# Patient Record
Sex: Female | Born: 1985 | Race: White | Hispanic: No | Marital: Single | Smoking: Never smoker
Health system: Southern US, Community
[De-identification: ages and names within clinical notes are randomized; demographics above are authoritative.]

## PROBLEM LIST (undated history)

## (undated) ENCOUNTER — Inpatient Hospital Stay: Payer: Self-pay

## (undated) DIAGNOSIS — K37 Unspecified appendicitis: Secondary | ICD-10-CM

## (undated) HISTORY — DX: Unspecified appendicitis: K37

---
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
## 2014-06-29 ENCOUNTER — Ambulatory Visit: Payer: Self-pay | Admitting: Family Medicine

## 2014-10-18 ENCOUNTER — Ambulatory Visit: Payer: Self-pay | Admitting: Family Medicine

## 2014-10-18 LAB — RAPID STREP-A WITH REFLX: MICRO TEXT REPORT: NEGATIVE

## 2014-10-21 LAB — BETA STREP CULTURE(ARMC)

## 2015-10-16 NOTE — L&D Delivery Note (Signed)
Delivery Note Primary OB: [facility] Delivery Physician: Annamarie MajorPaul Harris, MD Gestational Age: Full term Antepartum complications: none Intrapartum complications: None.  Fever right before delivery (101).  A viable Female was delivered via vertex perentation.  Apgars:8 ,9  Weight:  pending .   Placenta status: spontaneous and Intact.  Cord: 3+ vessels;  with the following complications: none.  Anesthesia:  epidural Episiotomy:  none Lacerations:  2nd Suture Repair: 2.0 vicryl Est. Blood Loss (mL):  200 mL  Mom to postpartum.  Baby to Couplet care / Skin to Skin.  Annamarie MajorPaul Harris, MD Dept of OB/GYN [phone]

## 2016-07-15 LAB — HM PAP SMEAR: HM Pap smear: NORMAL

## 2016-09-15 ENCOUNTER — Observation Stay
Admission: EM | Admit: 2016-09-15 | Discharge: 2016-09-15 | Disposition: A | Discharge status: Home / Self Care | Payer: BC Managed Care – PPO | Source: Home / Self Care | Admitting: Obstetrics & Gynecology

## 2016-09-15 DIAGNOSIS — Z3A40 40 weeks gestation of pregnancy: Secondary | ICD-10-CM

## 2016-09-15 DIAGNOSIS — R109 Unspecified abdominal pain: Secondary | ICD-10-CM | POA: Insufficient documentation

## 2016-09-15 DIAGNOSIS — O26899 Other specified pregnancy related conditions, unspecified trimester: Secondary | ICD-10-CM

## 2016-09-15 DIAGNOSIS — R32 Unspecified urinary incontinence: Secondary | ICD-10-CM

## 2016-09-15 DIAGNOSIS — O26893 Other specified pregnancy related conditions, third trimester: Secondary | ICD-10-CM

## 2016-09-15 DIAGNOSIS — N898 Other specified noninflammatory disorders of vagina: Secondary | ICD-10-CM

## 2016-09-15 MED ORDER — ACETAMINOPHEN 325 MG PO TABS: 650.0000 mg | ORAL_TABLET | ORAL | Status: DC | PRN

## 2016-09-15 MED ORDER — ONDANSETRON HCL 4 MG/2ML IJ SOLN: 4.0000 mg | Freq: Four times a day (QID) | INTRAMUSCULAR | Status: DC | PRN

## 2016-09-15 NOTE — Discharge Summary (Signed)
  See FPN 

## 2016-09-15 NOTE — OB Triage Note (Addendum)
Pt presents c/o  continuous LOF. States baby is moving well, Denies any pain or bleeding. Nitrazine questionable.

## 2016-09-15 NOTE — Final Progress Note (Signed)
Physician Final Progress Note  Patient ID: Joanna DolinKatie Aldridge Lynch MRN: [id number] DOB/AGE: 30/29/1987 30 y.o.  Admit date: 09/15/2016 Admitting provider: Nadara Mustardobert P Nella Botsford, MD Discharge date: 09/15/2016   Admission Diagnoses: Abdominal pain in pregnancy, also has vaginal discharge and urinary leakage  Discharge Diagnoses:  Active Problems:   Abdominal pain affecting pregnancy   Leakage of fluid/ vaginal discharge;  Urinary incontinence  Significant Findings/ Diagnostic Studies: Pt is a 30 yo G1P0 at 40 weeks with c/o vaginal discharge today, low grade lower abdominal pains.  No VB or fever, good FM.   PMH-none PSH- none FH- no GYN cancer or other d/o SH- No tob or EtOH or drugs ROS- as above, no other GYN or any other concerns EXAM- AF, VSS Chest clear Heart reg Extr no edema Back no CVAT SSE- +pool, Neg Nitrazine, Neg Fern SVE- no ext lesions; Cervix 1/70/-2 FHT 140s, no decels TOCO no ctxs  Procedures: A NST procedure was performed with FHR monitoring and a normal baseline established, appropriate time of 20-40 minutes of evaluation, and accels >2 seen w 15x15 characteristics.  Results show a REACTIVE NST.   Discharge Condition: good  Disposition: Final discharge disposition not confirmed  Diet: Regular diet  Discharge Activity: Activity as tolerated     Medication List    TAKE these medications   PRENATAL 1 PO Take by mouth daily.   ranitidine 150 MG tablet Commonly known as:  ZANTAC Take 150 mg by mouth 2 (two) times daily.   TUMS ULTRA 1000 400 MG chewable tablet Generic drug:  calcium elemental as carbonate Chew 1,000 mg by mouth 3 (three) times daily.      Follow-up Information    [facility]. Go on 09/17/2016.   Why:  Arrive at 8 pm for induction of labor. Contact information: [address] [place] [phone]          Total time spent taking care of this patient: 30 minutes  Signed: Letitia Libraobert Paul  Ibrahim Mcpheeters 09/15/2016, 3:03 PM

## 2016-09-15 NOTE — Discharge Instructions (Signed)
Braxton Hicks Contractions °Contractions of the uterus can occur throughout pregnancy. Contractions are not always a sign that you are in labor.  °WHAT ARE BRAXTON HICKS CONTRACTIONS?  °Contractions that occur before labor are called Braxton Hicks contractions, or false labor. Toward the end of pregnancy (32-34 weeks), these contractions can develop more often and may become more forceful. This is not true labor because these contractions do not result in opening (dilatation) and thinning of the cervix. They are sometimes difficult to tell apart from true labor because these contractions can be forceful and people have different pain tolerances. You should not feel embarrassed if you go to the hospital with false labor. Sometimes, the only way to tell if you are in true labor is for your health care provider to look for changes in the cervix. °If there are no prenatal problems or other health problems associated with the pregnancy, it is completely safe to be sent home with false labor and await the onset of true labor. °HOW CAN YOU TELL THE DIFFERENCE BETWEEN TRUE AND FALSE LABOR? °False Labor  °· The contractions of false labor are usually shorter and not as hard as those of true labor.   °· The contractions are usually irregular.   °· The contractions are often felt in the front of the lower abdomen and in the groin.   °· The contractions may go away when you walk around or change positions while lying down.   °· The contractions get weaker and are shorter lasting as time goes on.   °· The contractions do not usually become progressively stronger, regular, and closer together as with true labor.   °True Labor  °· Contractions in true labor last 30-70 seconds, become very regular, usually become more intense, and increase in frequency.   °· The contractions do not go away with walking.   °· The discomfort is usually felt in the top of the uterus and spreads to the lower abdomen and low back.   °· True labor can be  determined by your health care provider with an exam. This will show that the cervix is dilating and getting thinner.   °WHAT TO REMEMBER °· Keep up with your usual exercises and follow other instructions given by your health care provider.   °· Take medicines as directed by your health care provider.   °· Keep your regular prenatal appointments.   °· Eat and drink lightly if you think you are going into labor.   °· If Braxton Hicks contractions are making you uncomfortable:   °¨ Change your position from lying down or resting to walking, or from walking to resting.   °¨ Sit and rest in a tub of warm water.   °¨ Drink 2-3 glasses of water. Dehydration may cause these contractions.   °¨ Do slow and deep breathing several times an hour.   °WHEN SHOULD I SEEK IMMEDIATE MEDICAL CARE? °Seek immediate medical care if: °· Your contractions become stronger, more regular, and closer together.   °· You have fluid leaking or gushing from your vagina.   °· You have a fever.   °· You pass blood-tinged mucus.   °· You have vaginal bleeding.   °· You have continuous abdominal pain.   °· You have low back pain that you never had before.   °· You feel your baby's head pushing down and causing pelvic pressure.   °· Your baby is not moving as much as it used to.   °This information is not intended to replace advice given to you by your health care provider. Make sure you discuss any questions you have with your health care   provider. °Document Released: 10/01/2005 Document Revised: 01/23/2016 Document Reviewed: 07/13/2013 °Elsevier Interactive Patient Education © 2017 Elsevier Inc. ° °

## 2016-09-16 ENCOUNTER — Inpatient Hospital Stay
Admission: EM | Admit: 2016-09-16 | Discharge: 2016-09-19 | DRG: 774 | Disposition: A | Discharge status: Home / Self Care | Payer: BC Managed Care – PPO | Attending: Obstetrics & Gynecology | Admitting: Obstetrics & Gynecology

## 2016-09-16 DIAGNOSIS — M419 Scoliosis, unspecified: Secondary | ICD-10-CM | POA: Diagnosis present

## 2016-09-16 DIAGNOSIS — Z3A40 40 weeks gestation of pregnancy: Secondary | ICD-10-CM

## 2016-09-16 DIAGNOSIS — Z3493 Encounter for supervision of normal pregnancy, unspecified, third trimester: Secondary | ICD-10-CM | POA: Diagnosis present

## 2016-09-16 LAB — TYPE AND SCREEN
ABO/RH(D): O POS
ANTIBODY SCREEN: NEGATIVE

## 2016-09-16 LAB — CBC
HCT: 36.8 % (ref 35.0–47.0)
HEMOGLOBIN: 12.8 g/dL (ref 12.0–16.0)
MCH: 30.6 pg (ref 26.0–34.0)
MCHC: 34.9 g/dL (ref 32.0–36.0)
MCV: 87.7 fL (ref 80.0–100.0)
Platelets: 195 10*3/uL (ref 150–440)
RBC: 4.19 MIL/uL (ref 3.80–5.20)
RDW: 14.5 % (ref 11.5–14.5)
WBC: 14 10*3/uL — ABNORMAL HIGH (ref 3.6–11.0)

## 2016-09-16 MED ORDER — OXYTOCIN 10 UNIT/ML IJ SOLN
INTRAMUSCULAR | Status: AC
  Filled 2016-09-16: qty 2

## 2016-09-16 MED ORDER — BUTORPHANOL TARTRATE 1 MG/ML IJ SOLN
1.0000 mg | INTRAMUSCULAR | Status: DC | PRN
  Administered 2016-09-16 – 2016-09-17 (×2): 1 mg via INTRAVENOUS
  Filled 2016-09-16 (×2): qty 1

## 2016-09-16 MED ORDER — LACTATED RINGERS IV SOLN: 500.0000 mL | INTRAVENOUS | Status: DC | PRN

## 2016-09-16 MED ORDER — OXYTOCIN 40 UNITS IN LACTATED RINGERS INFUSION - SIMPLE MED
2.5000 [IU]/h | INTRAVENOUS | Status: DC
  Filled 2016-09-16 (×2): qty 1000

## 2016-09-16 MED ORDER — ONDANSETRON HCL 4 MG/2ML IJ SOLN: 4.0000 mg | Freq: Four times a day (QID) | INTRAMUSCULAR | Status: DC | PRN

## 2016-09-16 MED ORDER — LIDOCAINE HCL (PF) 1 % IJ SOLN
INTRAMUSCULAR | Status: AC
  Filled 2016-09-16: qty 30

## 2016-09-16 MED ORDER — LACTATED RINGERS IV SOLN
INTRAVENOUS | Status: DC
  Administered 2016-09-16: 23:00:00 via INTRAVENOUS

## 2016-09-16 MED ORDER — OXYTOCIN BOLUS FROM INFUSION: 500.0000 mL | Freq: Once | INTRAVENOUS | Status: DC

## 2016-09-16 MED ORDER — ACETAMINOPHEN 325 MG PO TABS: 650.0000 mg | ORAL_TABLET | ORAL | Status: DC | PRN

## 2016-09-16 MED ORDER — AMMONIA AROMATIC IN INHA
RESPIRATORY_TRACT | Status: AC
  Filled 2016-09-16: qty 10

## 2016-09-16 MED ORDER — MISOPROSTOL 200 MCG PO TABS
ORAL_TABLET | ORAL | Status: AC
  Filled 2016-09-16: qty 4

## 2016-09-16 NOTE — Progress Notes (Signed)
Obstetrics Admission History & Physical   Contractions   HPI:  30 y.o. G1P0 @ 2358w5d (09/11/2016, by Other Basis). Admitted on 09/16/2016:   Patient Active Problem List   Diagnosis Date Noted  . Normal labor 09/16/2016  . Abdominal pain affecting pregnancy 09/15/2016     Presents for ctx at ter,m, all day but worse now, with cervical change.  No ROM or VB.  Good FM.Marland Kitchen.  [facility]  PMHx: History reviewed. No pertinent past medical history. PSHx:  Past Surgical History:  Procedure Laterality Date  . NASAL SEPTUM SURGERY  2005   Medications:  Prescriptions Prior to Admission  Medication Sig Dispense Refill Last Dose  . calcium elemental as carbonate (TUMS ULTRA 1000) 400 MG chewable tablet Chew 1,000 mg by mouth 3 (three) times daily.   09/15/2016  . Prenatal MV-Min-Fe Fum-FA-DHA (PRENATAL 1 PO) Take by mouth daily.   09/15/2016  . ranitidine (ZANTAC) 150 MG tablet Take 150 mg by mouth 2 (two) times daily.   09/15/2016   Allergies: has no allergies on file. OBHx:  OB History  Gravida Para Term Preterm AB Living  1 0          SAB TAB Ectopic Multiple Live Births               # Outcome Date GA Lbr Len/2nd Weight Sex Delivery Anes PTL Lv  1 Current              ZOX:[id number]:Negative/unremarkable except as detailed in HPI. Soc Hx: Never smoker, Alcohol: none,and Recreational drug use: none  Objective:   Vitals:   09/16/16 2110  BP: (!) 147/80  Pulse: (!) 59   General: Well nourished, well developed female in no acute distress.  Skin: Warm and dry.  Cardiovascular:Regular rate and rhythm. Respiratory: Clear to auscultation bilateral. Normal respiratory effort Abdomen: moderate Neuro/Psych: Normal mood and affect.   Pelvic exam: is not limited by body habitus EGBUS: within normal limits Vagina: within normal limits and with normal mucosa blood in the vault Cervix: 3 cm Uterus: Spontaneous uterine activity  Adnexa: not evaluated  EFM:FHR: 140 bpm,  variability: moderate,  accelerations:  Present,  decelerations:  Absent Toco: Frequency: Every 3-6 minutes  Assessment & Plan:   30 y.o. G1P0 @ 4558w5d, Admitted on 09/16/2016:Active Labor     Admit for labor, Observe for cervical change, Fetal Wellbeing Reassuring, Epidural when ready and AROM when Appropriate

## 2016-09-17 ENCOUNTER — Encounter: Payer: Self-pay | Admitting: Anesthesiology

## 2016-09-17 ENCOUNTER — Inpatient Hospital Stay: Payer: BC Managed Care – PPO | Admitting: Anesthesiology

## 2016-09-17 MED ORDER — ZOLPIDEM TARTRATE 5 MG PO TABS: 5.0000 mg | ORAL_TABLET | Freq: Every evening | ORAL | Status: DC | PRN

## 2016-09-17 MED ORDER — SIMETHICONE 80 MG PO CHEW: 80.0000 mg | CHEWABLE_TABLET | ORAL | Status: DC | PRN

## 2016-09-17 MED ORDER — SODIUM CHLORIDE 0.9 % IV SOLN: 250.0000 mL | INTRAVENOUS | Status: DC | PRN

## 2016-09-17 MED ORDER — ONDANSETRON HCL 4 MG PO TABS: 4.0000 mg | ORAL_TABLET | ORAL | Status: DC | PRN

## 2016-09-17 MED ORDER — VARICELLA VIRUS VACCINE LIVE 1350 PFU/0.5ML IJ SUSR
0.5000 mL | Freq: Once | INTRAMUSCULAR | Status: AC
  Administered 2016-09-19: 0.5 mL via SUBCUTANEOUS
  Filled 2016-09-17 (×2): qty 0.5

## 2016-09-17 MED ORDER — ONDANSETRON HCL 4 MG/2ML IJ SOLN: 4.0000 mg | INTRAMUSCULAR | Status: DC | PRN

## 2016-09-17 MED ORDER — FENTANYL 2.5 MCG/ML W/ROPIVACAINE 0.2% IN NS 100 ML EPIDURAL INFUSION (ARMC-ANES)
EPIDURAL | Status: AC
  Filled 2016-09-17: qty 100

## 2016-09-17 MED ORDER — MEASLES, MUMPS & RUBELLA VAC ~~LOC~~ INJ
0.5000 mL | INJECTION | Freq: Once | SUBCUTANEOUS | Status: AC
  Administered 2016-09-19: 0.5 mL via SUBCUTANEOUS
  Filled 2016-09-17 (×2): qty 0.5

## 2016-09-17 MED ORDER — OXYCODONE-ACETAMINOPHEN 5-325 MG PO TABS
2.0000 | ORAL_TABLET | ORAL | Status: DC | PRN
  Administered 2016-09-19: 2 via ORAL
  Filled 2016-09-17: qty 2

## 2016-09-17 MED ORDER — IBUPROFEN 600 MG PO TABS
600.0000 mg | ORAL_TABLET | Freq: Four times a day (QID) | ORAL | Status: DC
  Administered 2016-09-17 – 2016-09-19 (×8): 600 mg via ORAL
  Filled 2016-09-17 (×8): qty 1

## 2016-09-17 MED ORDER — LIDOCAINE HCL (PF) 2 % IJ SOLN
INTRAMUSCULAR | Status: DC | PRN
  Administered 2016-09-17: 5 mL via INTRADERMAL

## 2016-09-17 MED ORDER — BUPIVACAINE HCL (PF) 0.25 % IJ SOLN
INTRAMUSCULAR | Status: DC | PRN
  Administered 2016-09-17: 10 mL via EPIDURAL

## 2016-09-17 MED ORDER — OXYCODONE-ACETAMINOPHEN 5-325 MG PO TABS
1.0000 | ORAL_TABLET | ORAL | Status: DC | PRN
  Administered 2016-09-18 – 2016-09-19 (×5): 1 via ORAL
  Filled 2016-09-17 (×5): qty 1

## 2016-09-17 MED ORDER — SODIUM CHLORIDE 0.9% FLUSH: 3.0000 mL | INTRAVENOUS | Status: DC | PRN

## 2016-09-17 MED ORDER — LIDOCAINE HCL (PF) 1 % IJ SOLN
INTRAMUSCULAR | Status: DC | PRN
  Administered 2016-09-17 (×2): 3 mL

## 2016-09-17 MED ORDER — EPHEDRINE 5 MG/ML INJ
10.0000 mg | INTRAVENOUS | Status: DC | PRN
  Filled 2016-09-17: qty 2

## 2016-09-17 MED ORDER — BENZOCAINE-MENTHOL 20-0.5 % EX AERO
1.0000 "application " | INHALATION_SPRAY | CUTANEOUS | Status: DC | PRN
  Filled 2016-09-17: qty 56

## 2016-09-17 MED ORDER — SODIUM CHLORIDE 0.9% FLUSH: 3.0000 mL | Freq: Two times a day (BID) | INTRAVENOUS | Status: DC

## 2016-09-17 MED ORDER — COCONUT OIL OIL
1.0000 "application " | TOPICAL_OIL | Status: DC | PRN
  Filled 2016-09-17: qty 120

## 2016-09-17 MED ORDER — FENTANYL 2.5 MCG/ML W/ROPIVACAINE 0.2% IN NS 100 ML EPIDURAL INFUSION (ARMC-ANES)
EPIDURAL | Status: DC | PRN
Start: 2016-09-17 — End: 2016-09-17
  Administered 2016-09-17: 10 mL/h via EPIDURAL

## 2016-09-17 MED ORDER — DIBUCAINE 1 % RE OINT: 1.0000 "application " | TOPICAL_OINTMENT | RECTAL | Status: DC | PRN

## 2016-09-17 MED ORDER — DIPHENHYDRAMINE HCL 50 MG/ML IJ SOLN: 12.5000 mg | INTRAMUSCULAR | Status: DC | PRN

## 2016-09-17 MED ORDER — SENNOSIDES-DOCUSATE SODIUM 8.6-50 MG PO TABS: 2.0000 | ORAL_TABLET | ORAL | Status: DC

## 2016-09-17 MED ORDER — PHENYLEPHRINE 40 MCG/ML (10ML) SYRINGE FOR IV PUSH (FOR BLOOD PRESSURE SUPPORT)
80.0000 ug | PREFILLED_SYRINGE | INTRAVENOUS | Status: DC | PRN
Start: 2016-09-17 — End: 2016-09-17
  Filled 2016-09-17: qty 5

## 2016-09-17 MED ORDER — WITCH HAZEL-GLYCERIN EX PADS: 1.0000 "application " | MEDICATED_PAD | CUTANEOUS | Status: DC | PRN

## 2016-09-17 MED ORDER — LACTATED RINGERS IV SOLN: 500.0000 mL | Freq: Once | INTRAVENOUS | Status: DC

## 2016-09-17 MED ORDER — CEFAZOLIN SODIUM-DEXTROSE 2-3 GM-% IV SOLR
2.0000 g | Freq: Two times a day (BID) | INTRAVENOUS | Status: DC
  Filled 2016-09-17 (×2): qty 50

## 2016-09-17 MED ORDER — DIPHENHYDRAMINE HCL 25 MG PO CAPS: 25.0000 mg | ORAL_CAPSULE | Freq: Four times a day (QID) | ORAL | Status: DC | PRN

## 2016-09-17 MED ORDER — FENTANYL 2.5 MCG/ML W/ROPIVACAINE 0.2% IN NS 100 ML EPIDURAL INFUSION (ARMC-ANES): 10.0000 mL/h | EPIDURAL | Status: DC

## 2016-09-17 MED ORDER — ACETAMINOPHEN 325 MG PO TABS
650.0000 mg | ORAL_TABLET | ORAL | Status: DC | PRN
  Administered 2016-09-17 – 2016-09-18 (×3): 650 mg via ORAL
  Filled 2016-09-17 (×3): qty 2

## 2016-09-17 MED ORDER — LIDOCAINE-EPINEPHRINE (PF) 1.5 %-1:200000 IJ SOLN
INTRAMUSCULAR | Status: DC | PRN
  Administered 2016-09-17: 4 mL via PERINEURAL

## 2016-09-17 MED ORDER — PHENYLEPHRINE 40 MCG/ML (10ML) SYRINGE FOR IV PUSH (FOR BLOOD PRESSURE SUPPORT)
80.0000 ug | PREFILLED_SYRINGE | INTRAVENOUS | Status: DC | PRN
  Filled 2016-09-17: qty 5

## 2016-09-17 NOTE — Anesthesia Preprocedure Evaluation (Addendum)
Anesthesia Evaluation  Patient identified by MRN, date of birth, ID band Patient awake    Reviewed: Allergy & Precautions, NPO status , Patient's Chart, lab work & pertinent test results  Airway Mallampati: II  TM Distance: >3 FB     Dental no notable dental hx.    Pulmonary neg pulmonary ROS,    Pulmonary exam normal        Cardiovascular negative cardio ROS Normal cardiovascular exam     Neuro/Psych negative neurological ROS  negative psych ROS   GI/Hepatic negative GI ROS, Neg liver ROS,   Endo/Other  negative endocrine ROS  Renal/GU negative Renal ROS  negative genitourinary   Musculoskeletal Patient states she has some curvature to her back   Abdominal Normal abdominal exam  (+)   Peds negative pediatric ROS (+)  Hematology negative hematology ROS (+)   Anesthesia Other Findings   Reproductive/Obstetrics (+) Pregnancy                            Anesthesia Physical Anesthesia Plan  ASA: II  Anesthesia Plan: Epidural   Post-op Pain Management:    Induction:   Airway Management Planned: Natural Airway  Additional Equipment:   Intra-op Plan:   Post-operative Plan:   Informed Consent: I have reviewed the patients History and Physical, chart, labs and discussed the procedure including the risks, benefits and alternatives for the proposed anesthesia with the patient or authorized representative who has indicated his/her understanding and acceptance.   Dental advisory given  Plan Discussed with: CRNA and Surgeon  Anesthesia Plan Comments:         Anesthesia Quick Evaluation

## 2016-09-17 NOTE — Discharge Summary (Signed)
Obstetrical Discharge Summary  Date of Admission: 09/16/2016 Date of Discharge: 09/19/2016 Discharge Diagnosis: Term Pregnancy-delivered Primary OB:  [facility]   Gestational Age at Delivery: 4973w6d  Antepartum complications: none Date of Delivery: 09/17/2016  Delivered By: Annamarie MajorPaul Harris, MD Delivery Type: spontaneous vaginal delivery  Delivery Note Primary OB: [facility] Delivery Physician: Annamarie MajorPaul Harris, MD Gestational Age: Full term Antepartum complications: none Intrapartum complications: None.  Fever right before delivery (101).  A viable Female was delivered via vertex perentation.  Apgars: 8,9  Weight:  3690g.   Placenta status: spontaneous and Intact.  Cord: 3+ vessels;  with the following complications: none.  Anesthesia:  epidural Episiotomy:  none Lacerations:  2nd Suture Repair: 2.0 vicryl Est. Blood Loss (mL):  200 mL  Mom to postpartum.  Baby to Couplet care / Skin to Skin.   Post partum course: Since the delivery, patient has tolerate activity, diet, and daily functions without difficulty or complication.  Min lochia.  No breast concerns at this time.  No signs of depression currently.   Postpartum Exam:General appearance: alert, cooperative, appears stated age and no distress GI: Fundus firm Extremities: Homans sign is negative, no sign of DVT  Disposition: home with infant Rh Immune globulin given: no Rubella vaccine given: Rubella non-immune considering vaccine Varicella vaccine given: Varicella non-immune considering vaccine Tdap vaccine given in AP or PP setting: given during prenatal care Flu vaccine given in AP or PP setting: given during prenatal care Contraception: plans progesterone only pill  Prenatal Labs: O POS//Rubella Not immune//RPR negative//HIV negative/HepB Surface Ag negative//plans to breastfeed  Plan:  Joanna Lynch was discharged to home in good condition. Follow-[facility]Harris [facility]  [facility]  Discharge Medications:   Medication List    STOP taking these medications   ranitidine 150 MG tablet Commonly known as:  ZANTAC   TUMS ULTRA 1000 400 MG chewable tablet Generic drug:  calcium elemental as carbonate     TAKE these medications   PRENATAL 1 PO Take by mouth daily.        Joanna Lynch,Joanna Lynch, CNM

## 2016-09-17 NOTE — Anesthesia Procedure Notes (Signed)
Epidural Patient location during procedure: OB Start time: 09/17/2016 1:00 AM End time: 09/17/2016 1:27 AM  Staffing Anesthesiologist: Yves DillARROLL, Lyndzee Kliebert Performed: anesthesiologist   Preanesthetic Checklist Completed: patient identified, site marked, surgical consent, pre-op evaluation, timeout performed, IV checked, risks and benefits discussed and monitors and equipment checked  Epidural Patient position: sitting Prep: Betadine and site prepped and draped Patient monitoring: heart rate, cardiac monitor, continuous pulse ox and blood pressure Approach: midline Location: L3-L4 Injection technique: LOR air  Needle:  Needle type: Tuohy  Needle gauge: 17 G Needle length: 9 cm Catheter type: closed end flexible Catheter size: 19 Gauge Test dose: negative and 1.5% lidocaine with Epi 1:200 K  Assessment Sensory level: T8  Additional Notes Time out called.  Patient admits some curvature to her back.  Back prepped and draped in sterile fashion.  A skin wheal was made in the L3-L4 interspace with 1% Lidocaine plain.  A 17 G tuohy needle was guided into the epidural space by a loss of resistance technique.  The epidural catheter was threaded 3 cm into the space and the test dose was negative.  No blood or paresthesias or fluid.  The epidural needle was angled to the right of midline secondary to apparent curve.  First attempt with 18 G Tuohy threaded into a vein.  The catheter was affixed to the back in sterile fashion.  Patient tolerated the procedure well.

## 2016-09-17 NOTE — H&P (Signed)
See H&P above, even though listed as "progress note" Pt in active labor, in a lot of pain w cervical change Epidural now Monitor for SROM (or assist as needed); labor progress; delivery

## 2016-09-18 LAB — CBC
HCT: 31.6 % — ABNORMAL LOW (ref 35.0–47.0)
Hemoglobin: 11 g/dL — ABNORMAL LOW (ref 12.0–16.0)
MCH: 31.4 pg (ref 26.0–34.0)
MCHC: 34.9 g/dL (ref 32.0–36.0)
MCV: 89.9 fL (ref 80.0–100.0)
PLATELETS: 183 10*3/uL (ref 150–440)
RBC: 3.52 MIL/uL — ABNORMAL LOW (ref 3.80–5.20)
RDW: 14.7 % — AB (ref 11.5–14.5)
WBC: 17.8 10*3/uL — AB (ref 3.6–11.0)

## 2016-09-18 LAB — RPR: RPR Ser Ql: NONREACTIVE

## 2016-09-18 NOTE — Progress Notes (Signed)
Post Partum Day 1 Subjective: Doing well, no complaints.  Tolerating regular diet, pain with PO meds, voiding and ambulating without difficulty. Pt has c/o pain in back. She was considering discharging to home today but the baby is not discharged so she will stay until tomorrow  No CP SOB F/C N/V or leg pain No HA, change of vision, RUQ/epigastric pain  Objective: BP 115/60 (BP Location: Left Arm)   Pulse 90   Temp 98 F (36.7 C) (Oral)   Resp 18   Ht 5\' 6"  (1.676 m)   Wt 208 lb (94.3 kg)   SpO2 98%   Breastfeeding   BMI 33.57 kg/m    Physical Exam:  General: NAD CV: RRR Pulm: nl effort, CTABL Lochia: moderate Uterine Fundus: fundus firm and below umbilicus DVT Evaluation: no cords, ttp LEs    Recent Labs  09/16/16 2313 09/18/16 0455  HGB 12.8 11.0*  HCT 36.8 31.6*  WBC 14.0* 17.8*  PLT 195 183    Assessment/Plan: 30 y.o. G1P1000 postpartum day # 1  1. Continue routine postpartum care 2. O+, RNI, VNI patient is considering vaccinations 3. TDAP/Flu UTD 4. Breastfeeding/Progesterone-only Pills 5. Disposition: Discharge to home tomorrow   Tresea MallGLEDHILL,Joanna Lynch, CNM

## 2016-09-18 NOTE — Anesthesia Postprocedure Evaluation (Signed)
Anesthesia Post Note  Patient: Joanna DolinKatie Aldridge Lynch  Procedure(s) Performed: * No procedures listed *  Patient location during evaluation: Mother Baby Anesthesia Type: Epidural Level of consciousness: awake and alert and oriented Pain management: satisfactory to patient Vital Signs Assessment: post-procedure vital signs reviewed and stable Respiratory status: respiratory function stable Cardiovascular status: blood pressure returned to baseline Postop Assessment: no headache, no backache, epidural receding, no signs of nausea or vomiting, patient able to bend at knees and adequate PO intake Anesthetic complications: no    Last Vitals:  Vitals:   09/17/16 2327 09/18/16 0433  BP: 109/61 112/65  Pulse: 90 95  Resp: 18 18  Temp: 37.2 C 37.1 C    Last Pain:  Vitals:   09/18/16 0433  TempSrc: Oral  PainSc:                  Clydene PughBeane, Damontay Alred D

## 2016-09-19 MED ORDER — OXYCODONE-ACETAMINOPHEN 5-325 MG PO TABS: 1.0000 | ORAL_TABLET | ORAL | 0 refills | Status: DC | PRN

## 2016-09-19 MED ORDER — NORETHINDRONE 0.35 MG PO TABS: 1.0000 | ORAL_TABLET | Freq: Every day | ORAL | 11 refills | Status: DC

## 2016-09-19 NOTE — Progress Notes (Signed)
All discharge instructions given to patient and she voiced understanding of all instructions given. Prescriptions given , she will make her own 6 wk f/u appt  Patient discharged home with infant in arms escorted out by auxilary.

## 2016-09-19 NOTE — Progress Notes (Signed)
Anesthesia follow-up:      Examined patient after concern for intermittant back pain after delivery.  The epidural was challenging with the patient's scoliosis, but the patient received good relief from labor pain.  Upon examination, the epidural site was clean, dry with no erythema or significant bruising.  There was some mild tenderness at the site , but the patient also complained of some bilateral para vertebral muscle achiness.  There was no sensory or motor deficit as the patient was also ambulating at the time.  I reassured the patient that the discomfort should wane over the next couple of weeks, and that she should continue her NSAIDS, and zantac for GI coverage.  Celebrex may be a good alternative to motrin as the GI side effects may be less.  In short, the patient has some paravertebral muscle strain in addition to site tenderness, both of which should improve to resolution of the pain.

## 2016-09-19 NOTE — Discharge Instructions (Signed)
Follow up sooner with fever, problems breathing, pain not helped by medications, severe depression( more than just baby blues, wanting to hurt yourself or the baby), severe bleeding ( saturating more than one pad an hour or large palm sized clots), no heavy lifting , no driving while taking narcotics, no douches, intercourse, tampons or enemas for 6 weeks Vaginal Delivery, Care After °Refer to this sheet in the next few weeks. These instructions provide you with information about caring for yourself after vaginal delivery. Your health care provider may also give you more specific instructions. Your treatment has been planned according to current medical practices, but problems sometimes occur. Call your health care provider if you have any problems or questions. °What can I expect after the procedure? °After vaginal delivery, it is common to have: °· Some bleeding from your vagina. °· Soreness in your abdomen, your vagina, and the area of skin between your vaginal opening and your anus (perineum). °· Pelvic cramps. °· Fatigue. °Follow these instructions at home: °Medicines  °· Take over-the-counter and prescription medicines only as told by your health care provider. °· If you were prescribed an antibiotic medicine, take it as told by your health care provider. Do not stop taking the antibiotic until it is finished. °Driving  ° °· Do not drive or operate heavy machinery while taking prescription pain medicine. °· Do not drive for 24 hours if you received a sedative. °Lifestyle  °· Do not drink alcohol. This is especially important if you are breastfeeding or taking medicine to relieve pain. °· Do not use tobacco products, including cigarettes, chewing tobacco, or e-cigarettes. If you need help quitting, ask your health care provider. °Eating and drinking  °· Drink at least 8 eight-ounce glasses of water every day unless you are told not to by your health care provider. If you choose to breastfeed your baby, you may  need to drink more water than this. °· Eat high-fiber foods every day. These foods may help prevent or relieve constipation. High-fiber foods include: °¨ Whole grain cereals and breads. °¨ Brown rice. °¨ Beans. °¨ Fresh fruits and vegetables. °Activity  °· Return to your normal activities as told by your health care provider. Ask your health care provider what activities are safe for you. °· Rest as much as possible. Try to rest or take a nap when your baby is sleeping. °· Do not lift anything that is heavier than your baby or 10 lb (4.5 kg) until your health care provider says that it is safe. °· Talk with your health care provider about when you can engage in sexual activity. This may depend on your: °¨ Risk of infection. °¨ Rate of healing. °¨ Comfort and desire to engage in sexual activity. °Vaginal Care  °· If you have an episiotomy or a vaginal tear, check the area every day for signs of infection. Check for: °¨ More redness, swelling, or pain. °¨ More fluid or blood. °¨ Warmth. °¨ Pus or a bad smell. °· Do not use tampons or douches until your health care provider says this is safe. °· Watch for any blood clots that may pass from your vagina. These may look like clumps of dark red, brown, or black discharge. °General instructions  °· Keep your perineum clean and dry as told by your health care provider. °· Wear loose, comfortable clothing. °· Wipe from front to back when you use the toilet. °· Ask your health care provider if you can shower or take a bath.   If you had an episiotomy or a perineal tear during labor and delivery, your health care provider may tell you not to take baths for a certain length of time. °· Wear a bra that supports your breasts and fits you well. °· If possible, have someone help you with household activities and help care for your baby for at least a few days after you leave the hospital. °· Keep all follow-up visits for you and your baby as told by your health care provider. This is  important. °Contact a health care provider if: °· You have: °¨ Vaginal discharge that has a bad smell. °¨ Difficulty urinating. °¨ Pain when urinating. °¨ A sudden increase or decrease in the frequency of your bowel movements. °¨ More redness, swelling, or pain around your episiotomy or vaginal tear. °¨ More fluid or blood coming from your episiotomy or vaginal tear. °¨ Pus or a bad smell coming from your episiotomy or vaginal tear. °¨ A fever. °¨ A rash. °¨ Little or no interest in activities you used to enjoy. °¨ Questions about caring for yourself or your baby. °· Your episiotomy or vaginal tear feels warm to the touch. °· Your episiotomy or vaginal tear is separating or does not appear to be healing. °· Your breasts are painful, hard, or turn red. °· You feel unusually sad or worried. °· You feel nauseous or you vomit. °· You pass large blood clots from your vagina. If you pass a blood clot from your vagina, save it to show to your health care provider. Do not flush blood clots down the toilet without having your health care provider look at them. °· You urinate more than usual. °· You are dizzy or light-headed. °· You have not breastfed at all and you have not had a menstrual period for 12 weeks after delivery. °· You have stopped breastfeeding and you have not had a menstrual period for 12 weeks after you stopped breastfeeding. °Get help right away if: °· You have: °¨ Pain that does not go away or does not get better with medicine. °¨ Chest pain. °¨ Difficulty breathing. °¨ Blurred vision or spots in your vision. °¨ Thoughts about hurting yourself or your baby. °· You develop pain in your abdomen or in one of your legs. °· You develop a severe headache. °· You faint. °· You bleed from your vagina so much that you fill two sanitary pads in one hour. °This information is not intended to replace advice given to you by your health care provider. Make sure you discuss any questions you have with your health care  provider. °Document Released: 09/28/2000 Document Revised: 03/14/2016 Document Reviewed: 10/16/2015 °Elsevier Interactive Patient Education © 2017 [employer]. ° °

## 2016-09-19 NOTE — Addendum Note (Signed)
Addendum  created 09/19/16 1522 by Yves DillPaul Jonia Oakey, MD   Sign clinical note

## 2016-09-19 NOTE — Addendum Note (Signed)
Addendum  created 09/19/16 0703 by Yves DillPaul Jovahn Breit, MD   Order list changed

## 2016-10-15 DIAGNOSIS — K37 Unspecified appendicitis: Secondary | ICD-10-CM

## 2016-10-15 HISTORY — DX: Unspecified appendicitis: K37

## 2016-10-29 ENCOUNTER — Ambulatory Visit
Admission: EM | Admit: 2016-10-29 | Discharge: 2016-10-29 | Disposition: A | Discharge status: Home / Self Care | Payer: BC Managed Care – PPO | Attending: Family Medicine | Admitting: Family Medicine

## 2016-10-29 DIAGNOSIS — J111 Influenza due to unidentified influenza virus with other respiratory manifestations: Secondary | ICD-10-CM

## 2016-10-29 DIAGNOSIS — R69 Illness, unspecified: Secondary | ICD-10-CM | POA: Diagnosis not present

## 2016-10-29 LAB — RAPID INFLUENZA A&B ANTIGENS
Influenza A (ARMC): NEGATIVE
Influenza B (ARMC): NEGATIVE

## 2016-10-29 MED ORDER — OSELTAMIVIR PHOSPHATE 75 MG PO CAPS: 75.0000 mg | ORAL_CAPSULE | Freq: Two times a day (BID) | ORAL | 0 refills | Status: DC

## 2016-10-29 NOTE — ED Provider Notes (Signed)
[facility]    CSN: [id number] Arrival date & time: 10/29/16  1029     History   Chief Complaint Chief Complaint  Patient presents with  . Chills    HPI Joanna Lynch is a 31 y.o. female.   The history is provided by the patient.  URI  Presenting symptoms: fatigue and fever   Presenting symptoms comment:  Chills, bodyaches Severity:  Moderate Onset quality:  Sudden Duration:  1 day Timing:  Constant Progression:  Worsening Chronicity:  New Relieved by:  None tried Ineffective treatments:  None tried Associated symptoms: myalgias   Risk factors: not elderly, no chronic cardiac disease, no chronic kidney disease, no chronic respiratory disease, no diabetes mellitus, no immunosuppression, no recent illness and no recent travel     History reviewed. No pertinent past medical history.  Patient Active Problem List   Diagnosis Date Noted  . Postpartum care following vaginal delivery 09/18/2016  . Normal labor 09/16/2016  . Abdominal pain affecting pregnancy 09/15/2016    Past Surgical History:  Procedure Laterality Date  . NASAL SEPTUM SURGERY  2005    OB History    Gravida Para Term Preterm AB Living   1 1 1          SAB TAB Ectopic Multiple Live Births         0         Home Medications    Prior to Admission medications   Medication Sig Start Date End Date Taking? Authorizing Provider  Prenatal MV-Min-Fe Fum-FA-DHA (PRENATAL 1 PO) Take by mouth daily.   Yes Historical Provider, MD  norethindrone (CAMILA) 0.35 MG tablet Take 1 tablet (0.35 mg total) by mouth daily. 10/07/16   Tresea Mall, CNM  oseltamivir (TAMIFLU) 75 MG capsule Take 1 capsule (75 mg total) by mouth 2 (two) times daily. 10/29/16   Payton Mccallum, MD  oxyCODONE-acetaminophen (ROXICET) 5-325 MG tablet Take 1 tablet by mouth every 4 (four) hours as needed for severe pain. 09/19/16   Tresea Mall, CNM    Family History History reviewed. No pertinent family  history.  Social History Social History  Substance Use Topics  . Smoking status: Never Smoker  . Smokeless tobacco: Never Used  . Alcohol use No     Allergies   Patient has no known allergies.   Review of Systems Review of Systems  Constitutional: Positive for fatigue and fever.  Musculoskeletal: Positive for myalgias.     Physical Exam Triage Vital Signs ED Triage Vitals  Enc Vitals Group     BP 10/29/16 1049 115/76     Pulse Rate 10/29/16 1049 (!) 106     Resp 10/29/16 1049 17     Temp 10/29/16 1049 98.8 F (37.1 C)     Temp Source 10/29/16 1049 Oral     SpO2 10/29/16 1049 99 %     Weight 10/29/16 1047 175 lb (79.4 kg)     Height 10/29/16 1047 5\' 6"  (1.676 m)     Head Circumference --      Peak Flow --      Pain Score 10/29/16 1050 4     Pain Loc --      Pain Edu? --      Excl. in GC? --    No data found.   Updated Vital Signs BP 115/76 (BP Location: Left Arm)   Pulse (!) 106   Temp 98.8 F (37.1 C) (Oral)   Resp 17   Ht 5\' 6"  (  1.676 m)   Wt 175 lb (79.4 kg)   SpO2 99%   Breastfeeding? Yes   BMI 28.25 kg/m   Visual Acuity Right Eye Distance:   Left Eye Distance:   Bilateral Distance:    Right Eye Near:   Left Eye Near:    Bilateral Near:     Physical Exam  Constitutional: She appears well-developed and well-nourished. No distress.  HENT:  Head: Normocephalic and atraumatic.  Right Ear: Tympanic membrane, external ear and ear canal normal.  Left Ear: Tympanic membrane, external ear and ear canal normal.  Nose: Mucosal edema and rhinorrhea present. No nose lacerations, sinus tenderness, nasal deformity, septal deviation or nasal septal hematoma. No epistaxis.  No foreign bodies. Right sinus exhibits no maxillary sinus tenderness and no frontal sinus tenderness. Left sinus exhibits no maxillary sinus tenderness and no frontal sinus tenderness.  Mouth/Throat: Uvula is midline, oropharynx is clear and moist and mucous membranes are normal. No  oropharyngeal exudate.  Eyes: Conjunctivae and EOM are normal. Pupils are equal, round, and reactive to light. Right eye exhibits no discharge. Left eye exhibits no discharge. No scleral icterus.  Neck: Normal range of motion. Neck supple. No thyromegaly present.  Cardiovascular: Normal rate, regular rhythm and normal heart sounds.   Pulmonary/Chest: Effort normal and breath sounds normal. No respiratory distress. She has no wheezes. She has no rales.  Lymphadenopathy:    She has no cervical adenopathy.  Skin: She is not diaphoretic.  Nursing note and vitals reviewed.    UC Treatments / Results  Labs (all labs ordered are listed, but only abnormal results are displayed) Labs Reviewed  RAPID INFLUENZA A&B ANTIGENS ([facility] ONLY)    EKG  EKG Interpretation None       Radiology No results found.  Procedures Procedures (including critical care time)  Medications Ordered in UC Medications - No data to display   Initial Impression / Assessment and Plan / UC Course  I have reviewed the triage vital signs and the nursing notes.  Pertinent labs & imaging results that were available during my care of the patient were reviewed by me and considered in my medical decision making (see chart for details).  Clinical Course      Final Clinical Impressions(s) / UC Diagnoses   Final diagnoses:  Influenza-like illness    New Prescriptions Discharge Medication List as of 10/29/2016 11:57 AM    START taking these medications   Details  oseltamivir (TAMIFLU) 75 MG capsule Take 1 capsule (75 mg total) by mouth 2 (two) times daily., Starting Mon 10/29/2016, Normal       1 diagnosis reviewed with patient 2. rx as per orders above; reviewed possible side effects, interactions, risks and benefits  3. Recommend supportive treatment with increased fluids 4. Follow-up prn if symptoms worsen or don't improve   Payton Mccallumrlando Mekiah Cambridge, MD 10/29/16 1349

## 2016-10-29 NOTE — ED Triage Notes (Signed)
Patient complains of body aches, chills, dizziness. Patient states that symptoms started yesterday with headache. Patient reports that she is breastfeeding and has taken some tylenol to help feel better.

## 2016-10-31 ENCOUNTER — Emergency Department: Payer: BC Managed Care – PPO

## 2016-10-31 ENCOUNTER — Encounter: Payer: Self-pay | Admitting: Emergency Medicine

## 2016-10-31 ENCOUNTER — Inpatient Hospital Stay
Admission: EM | Admit: 2016-10-31 | Discharge: 2016-11-02 | DRG: 776 | Disposition: A | Discharge status: Home / Self Care | Payer: BC Managed Care – PPO | Attending: Surgery | Admitting: Surgery

## 2016-10-31 DIAGNOSIS — O9122 Nonpurulent mastitis associated with the puerperium: Secondary | ICD-10-CM | POA: Diagnosis present

## 2016-10-31 DIAGNOSIS — K3580 Unspecified acute appendicitis: Secondary | ICD-10-CM | POA: Diagnosis present

## 2016-10-31 DIAGNOSIS — O9963 Diseases of the digestive system complicating the puerperium: Principal | ICD-10-CM | POA: Diagnosis present

## 2016-10-31 DIAGNOSIS — R1031 Right lower quadrant pain: Secondary | ICD-10-CM

## 2016-10-31 LAB — CBC
HCT: 39.2 % (ref 35.0–47.0)
Hemoglobin: 13.3 g/dL (ref 12.0–16.0)
MCH: 29.4 pg (ref 26.0–34.0)
MCHC: 34 g/dL (ref 32.0–36.0)
MCV: 86.5 fL (ref 80.0–100.0)
PLATELETS: 247 10*3/uL (ref 150–440)
RBC: 4.53 MIL/uL (ref 3.80–5.20)
RDW: 13.8 % (ref 11.5–14.5)
WBC: 18.7 10*3/uL — AB (ref 3.6–11.0)

## 2016-10-31 LAB — URINALYSIS, COMPLETE (UACMP) WITH MICROSCOPIC
Bilirubin Urine: NEGATIVE
GLUCOSE, UA: NEGATIVE mg/dL
Hgb urine dipstick: NEGATIVE
KETONES UR: 5 mg/dL — AB
NITRITE: NEGATIVE
PH: 5 (ref 5.0–8.0)
Protein, ur: NEGATIVE mg/dL
Specific Gravity, Urine: 1.017 (ref 1.005–1.030)

## 2016-10-31 LAB — COMPREHENSIVE METABOLIC PANEL
ALK PHOS: 85 U/L (ref 38–126)
ALT: 19 U/L (ref 14–54)
AST: 21 U/L (ref 15–41)
Albumin: 4.1 g/dL (ref 3.5–5.0)
Anion gap: 9 (ref 5–15)
BILIRUBIN TOTAL: 0.5 mg/dL (ref 0.3–1.2)
BUN: 11 mg/dL (ref 6–20)
CALCIUM: 8.8 mg/dL — AB (ref 8.9–10.3)
CO2: 23 mmol/L (ref 22–32)
Chloride: 107 mmol/L (ref 101–111)
Creatinine, Ser: 0.91 mg/dL (ref 0.44–1.00)
Glucose, Bld: 108 mg/dL — ABNORMAL HIGH (ref 65–99)
Potassium: 3.6 mmol/L (ref 3.5–5.1)
Sodium: 139 mmol/L (ref 135–145)
TOTAL PROTEIN: 7.4 g/dL (ref 6.5–8.1)

## 2016-10-31 LAB — POCT PREGNANCY, URINE: Preg Test, Ur: NEGATIVE

## 2016-10-31 LAB — LIPASE, BLOOD: Lipase: 20 U/L (ref 11–51)

## 2016-10-31 MED ORDER — IOPAMIDOL (ISOVUE-300) INJECTION 61%
100.0000 mL | Freq: Once | INTRAVENOUS | Status: AC | PRN
  Administered 2016-10-31: 100 mL via INTRAVENOUS

## 2016-10-31 MED ORDER — ONDANSETRON 4 MG PO TBDP
4.0000 mg | ORAL_TABLET | Freq: Once | ORAL | Status: AC | PRN
  Administered 2016-10-31: 4 mg via ORAL
  Filled 2016-10-31: qty 1

## 2016-10-31 MED ORDER — SODIUM CHLORIDE 0.9 % IV BOLUS (SEPSIS)
1000.0000 mL | Freq: Once | INTRAVENOUS | Status: AC
  Administered 2016-10-31: 1000 mL via INTRAVENOUS

## 2016-10-31 MED ORDER — IOPAMIDOL (ISOVUE-300) INJECTION 61%
30.0000 mL | Freq: Once | INTRAVENOUS | Status: AC
  Administered 2016-10-31: 30 mL via ORAL

## 2016-10-31 NOTE — ED Notes (Signed)
Pt assisted to triage room for privacy to allow pt to breast pump; pt reports recently dx with mastitis and has several questions regarding stopping breast feeding; pt encouraged to wear well-fitting bra at all times and given phone number to [facility] lactation consultant for her to call tomorrow to get further information; pt voices understanding and updated on wait time

## 2016-10-31 NOTE — ED Provider Notes (Signed)
[facility] Emergency Department Provider Note   ____________________________________________   I have reviewed the triage vital signs and the nursing notes.   HISTORY  Chief Complaint Abdominal Pain   History limited by: Not Limited   HPI Ramatoulaye Teodora Baumgarten is a 31 y.o. female who presents to the emergency department today because of concerns for abdominal pain. The pain started this morning. She states it is located in the right lower quadrant. It did become worse and sharp however at the time of my examination she states it is feeling somewhat better. She did have some associated nausea with vomiting. She has not noticed any change in her bowel movements. No change in urination. No abnormal vaginal discharge.   History reviewed. No pertinent past medical history.  Patient Active Problem List   Diagnosis Date Noted  . Postpartum care following vaginal delivery 09/18/2016  . Normal labor 09/16/2016  . Abdominal pain affecting pregnancy 09/15/2016    Past Surgical History:  Procedure Laterality Date  . NASAL SEPTUM SURGERY  2005    Prior to Admission medications   Medication Sig Start Date End Date Taking? Authorizing Provider  norethindrone (CAMILA) 0.35 MG tablet Take 1 tablet (0.35 mg total) by mouth daily. 10/07/16   Tresea Mall, CNM  oseltamivir (TAMIFLU) 75 MG capsule Take 1 capsule (75 mg total) by mouth 2 (two) times daily. 10/29/16   Payton Mccallum, MD  oxyCODONE-acetaminophen (ROXICET) 5-325 MG tablet Take 1 tablet by mouth every 4 (four) hours as needed for severe pain. 09/19/16   Tresea Mall, CNM  Prenatal MV-Min-Fe Fum-FA-DHA (PRENATAL 1 PO) Take by mouth daily.    Historical Provider, MD    Allergies Patient has no known allergies.  No family history on file.  Social History Social History  Substance Use Topics  . Smoking status: Never Smoker  . Smokeless tobacco: Never Used  . Alcohol use No    Review of  Systems  Constitutional: Negative for fever. Cardiovascular: Negative for chest pain. Respiratory: Negative for shortness of breath. Gastrointestinal: Positive for right lower quadrant abdominal pain. Genitourinary: Negative for dysuria. Musculoskeletal: Negative for back pain. Skin: Negative for rash. Neurological: Negative for headaches, focal weakness or numbness.  10-point ROS otherwise negative.  ____________________________________________   PHYSICAL EXAM:  VITAL SIGNS: ED Triage Vitals [10/31/16 1826]  Enc Vitals Group     BP 121/62     Pulse Rate 87     Resp 16     Temp 98.1 F (36.7 C)     Temp Source Oral     SpO2 98 %     Weight 175 lb (79.4 kg)     Height 5\' 6"  (1.676 m)     Head Circumference      Peak Flow      Pain Score 7   Constitutional: Alert and oriented. Well appearing and in no distress. Eyes: Conjunctivae are normal. Normal extraocular movements. ENT   Head: Normocephalic and atraumatic.   Nose: No congestion/rhinnorhea.   Mouth/Throat: Mucous membranes are moist.   Neck: No stridor. Hematological/Lymphatic/Immunilogical: No cervical lymphadenopathy. Cardiovascular: Normal rate, regular rhythm.  No murmurs, rubs, or gallops. Respiratory: Normal respiratory effort without tachypnea nor retractions. Breath sounds are clear and equal bilaterally. No wheezes/rales/rhonchi. Gastrointestinal: Soft. Tender to palpation in the right lower quadrant with rebound. Positive Rovsing sign.  Genitourinary: Deferred Musculoskeletal: Normal range of motion in all extremities. No lower extremity edema. Neurologic:  Normal speech and language. No gross focal neurologic deficits are  appreciated.  Skin:  Skin is warm, dry and intact. No rash noted. Psychiatric: Mood and affect are normal. Speech and behavior are normal. Patient exhibits appropriate insight and judgment.  ____________________________________________    LABS (pertinent  positives/negatives)  Labs Reviewed  COMPREHENSIVE METABOLIC PANEL - Abnormal; Notable for the following:       Result Value   Glucose, Bld 108 (*)    Calcium 8.8 (*)    All other components within normal limits  CBC - Abnormal; Notable for the following:    WBC 18.7 (*)    All other components within normal limits  URINALYSIS, COMPLETE (UACMP) WITH MICROSCOPIC - Abnormal; Notable for the following:    Color, Urine YELLOW (*)    APPearance CLEAR (*)    Ketones, ur 5 (*)    Leukocytes, UA MODERATE (*)    Bacteria, UA RARE (*)    Squamous Epithelial / LPF 6-30 (*)    All other components within normal limits  LIPASE, BLOOD  CBC  BASIC METABOLIC PANEL  POC URINE PREG, ED  POCT PREGNANCY, URINE     ____________________________________________   EKG  None  ____________________________________________    RADIOLOGY  CT abd/pel IMPRESSION:  Acute appendicitis. Retrocecal appendix. No evidence for perforation  or abscess at this time.      ____________________________________________   PROCEDURES  Procedures  ____________________________________________   INITIAL IMPRESSION / ASSESSMENT AND PLAN / ED COURSE  Pertinent labs & imaging results that were available during my care of the patient were reviewed by me and considered in my medical decision making (see chart for details).  Patient presented to the emergency department today because of concerns for right lower quadrant pain. CT scan shows acute appendicitis. Patient will be admitted to the surgical service.  ____________________________________________   FINAL CLINICAL IMPRESSION(S) / ED DIAGNOSES  Final diagnoses:  Right lower quadrant abdominal pain  Acute appendicitis, unspecified acute appendicitis type     Note: This dictation was prepared with Dragon dictation. Any transcriptional errors that result from this process are unintentional     Phineas SemenGraydon Samani Deal, MD 11/01/16 [phone]

## 2016-10-31 NOTE — ED Triage Notes (Signed)
Patient presents to the ED with right lower quadrant abdominal pain and vomiting x 1 today.  Patient states pain radiates into her mid-abdomen.  Patient reports she has felt unwell the past few days and was diagnosed with mastitis yesterday.  Patient is alert and oriented x 4.  Patient is 6 weeks post-partum.

## 2016-11-01 ENCOUNTER — Encounter: Payer: Self-pay | Admitting: Surgery

## 2016-11-01 DIAGNOSIS — O9122 Nonpurulent mastitis associated with the puerperium: Secondary | ICD-10-CM | POA: Diagnosis present

## 2016-11-01 DIAGNOSIS — R1031 Right lower quadrant pain: Secondary | ICD-10-CM | POA: Diagnosis present

## 2016-11-01 DIAGNOSIS — K3580 Unspecified acute appendicitis: Secondary | ICD-10-CM | POA: Diagnosis present

## 2016-11-01 DIAGNOSIS — K353 Acute appendicitis with localized peritonitis: Secondary | ICD-10-CM | POA: Diagnosis not present

## 2016-11-01 DIAGNOSIS — O9963 Diseases of the digestive system complicating the puerperium: Secondary | ICD-10-CM | POA: Diagnosis present

## 2016-11-01 LAB — CBC
HCT: 33.6 % — ABNORMAL LOW (ref 35.0–47.0)
HEMOGLOBIN: 11.9 g/dL — AB (ref 12.0–16.0)
MCH: 30.2 pg (ref 26.0–34.0)
MCHC: 35.3 g/dL (ref 32.0–36.0)
MCV: 85.6 fL (ref 80.0–100.0)
Platelets: 202 10*3/uL (ref 150–440)
RBC: 3.93 MIL/uL (ref 3.80–5.20)
RDW: 13.8 % (ref 11.5–14.5)
WBC: 8.8 10*3/uL (ref 3.6–11.0)

## 2016-11-01 LAB — BASIC METABOLIC PANEL
ANION GAP: 5 (ref 5–15)
BUN: 8 mg/dL (ref 6–20)
CHLORIDE: 107 mmol/L (ref 101–111)
CO2: 27 mmol/L (ref 22–32)
Calcium: 8.1 mg/dL — ABNORMAL LOW (ref 8.9–10.3)
Creatinine, Ser: 0.93 mg/dL (ref 0.44–1.00)
Glucose, Bld: 105 mg/dL — ABNORMAL HIGH (ref 65–99)
POTASSIUM: 3.6 mmol/L (ref 3.5–5.1)
SODIUM: 139 mmol/L (ref 135–145)

## 2016-11-01 MED ORDER — HYDROMORPHONE HCL 1 MG/ML IJ SOLN: 0.5000 mg | INTRAMUSCULAR | Status: DC | PRN

## 2016-11-01 MED ORDER — KETOROLAC TROMETHAMINE 30 MG/ML IJ SOLN
30.0000 mg | Freq: Four times a day (QID) | INTRAMUSCULAR | Status: DC
  Administered 2016-11-01 (×2): 30 mg via INTRAVENOUS
  Filled 2016-11-01 (×2): qty 1

## 2016-11-01 MED ORDER — ONDANSETRON HCL 4 MG/2ML IJ SOLN: 4.0000 mg | Freq: Four times a day (QID) | INTRAMUSCULAR | Status: DC | PRN

## 2016-11-01 MED ORDER — OXYCODONE HCL 5 MG PO TABS: 5.0000 mg | ORAL_TABLET | ORAL | Status: DC | PRN

## 2016-11-01 MED ORDER — ACETAMINOPHEN 325 MG PO TABS
650.0000 mg | ORAL_TABLET | Freq: Four times a day (QID) | ORAL | Status: DC
  Administered 2016-11-01 – 2016-11-02 (×3): 650 mg via ORAL
  Filled 2016-11-01 (×3): qty 2

## 2016-11-01 MED ORDER — ONDANSETRON 4 MG PO TBDP: 4.0000 mg | ORAL_TABLET | Freq: Four times a day (QID) | ORAL | Status: DC | PRN

## 2016-11-01 MED ORDER — PIPERACILLIN-TAZOBACTAM 3.375 G IVPB
3.3750 g | Freq: Three times a day (TID) | INTRAVENOUS | Status: DC
  Administered 2016-11-01 (×2): 3.375 g via INTRAVENOUS
  Filled 2016-11-01 (×4): qty 50

## 2016-11-01 MED ORDER — ACETAMINOPHEN 325 MG PO TABS
650.0000 mg | ORAL_TABLET | Freq: Four times a day (QID) | ORAL | Status: DC
  Administered 2016-11-01 (×2): 650 mg via ORAL
  Filled 2016-11-01 (×2): qty 2

## 2016-11-01 MED ORDER — KETOROLAC TROMETHAMINE 30 MG/ML IJ SOLN
30.0000 mg | Freq: Four times a day (QID) | INTRAMUSCULAR | Status: DC
  Administered 2016-11-01 – 2016-11-02 (×3): 30 mg via INTRAVENOUS
  Filled 2016-11-01 (×3): qty 1

## 2016-11-01 MED ORDER — DEXTROSE IN LACTATED RINGERS 5 % IV SOLN
INTRAVENOUS | Status: DC
  Administered 2016-11-01 – 2016-11-02 (×3): via INTRAVENOUS

## 2016-11-01 MED ORDER — PIPERACILLIN-TAZOBACTAM 3.375 G IVPB
3.3750 g | Freq: Three times a day (TID) | INTRAVENOUS | Status: DC
  Administered 2016-11-01 – 2016-11-02 (×2): 3.375 g via INTRAVENOUS
  Filled 2016-11-01 (×3): qty 50

## 2016-11-01 NOTE — H&P (Signed)
Joanna Lynch is an 31 y.o. female.   Chief Complaint: abdominal pain HPI: 31 year old female who has complaint of abdominal pain malaise and fever. Patient started feeling some abdominal pain that was mild and felt like gas all over the abdomen yesterday a.m. the patient states that the pain continued to climb until he worsened and then moved to the right lower quadrant around 3 PM. Patient states that the pain in the right lower quadrant was about 8 out of 10 in gassy pain that came and went in waves and was stabbing in nature. Patient also endorses a fever and chills. Patient gone to urgent care on Monday and was diagnosed potentially with the flu and started on Tamiflu at that time and then began having redness in the right breast and so was then diagnosed with mastitis and given Bactrim which she has taken 2 tablets of prior to the abdominal pain starting. She also had an episode of nausea and one episode of vomiting and she does have a decreased appetite. Patient denies any diarrhea constipation hematuria or abnormal discharge. She did have a normal pregnancy with birth of a healthy baby girl  6 weeks ago.  PMHx: Mastitis on Right, 6 weeks post partum   Past Surgical History:  Procedure Laterality Date  . NASAL SEPTUM SURGERY  2005   Family History: Aunt: breast CA  Social History:  reports that she has never smoked. She has never used smokeless tobacco. She reports that she does not drink alcohol or use drugs.  Allergies: No Known Allergies   (Not in a hospital admission)  Results for orders placed or performed during the hospital encounter of 10/31/16 (from the past 48 hour(s))  Lipase, blood     Status: None   Collection Time: 10/31/16  6:42 PM  Result Value Ref Range   Lipase 20 11 - 51 U/L  Comprehensive metabolic panel     Status: Abnormal   Collection Time: 10/31/16  6:42 PM  Result Value Ref Range   Sodium 139 135 - 145 mmol/L   Potassium 3.6 3.5 - 5.1 mmol/L    Chloride 107 101 - 111 mmol/L   CO2 23 22 - 32 mmol/L   Glucose, Bld 108 (H) 65 - 99 mg/dL   BUN 11 6 - 20 mg/dL   Creatinine, Ser 0.91 0.44 - 1.00 mg/dL   Calcium 8.8 (L) 8.9 - 10.3 mg/dL   Total Protein 7.4 6.5 - 8.1 g/dL   Albumin 4.1 3.5 - 5.0 g/dL   AST 21 15 - 41 U/L   ALT 19 14 - 54 U/L   Alkaline Phosphatase 85 38 - 126 U/L   Total Bilirubin 0.5 0.3 - 1.2 mg/dL   GFR calc non Af Amer >60 >60 mL/min   GFR calc Af Amer >60 >60 mL/min    Comment: (NOTE) The eGFR has been calculated using the CKD EPI equation. This calculation has not been validated in all clinical situations. eGFR's persistently <60 mL/min signify possible Chronic Kidney Disease.    Anion gap 9 5 - 15  CBC     Status: Abnormal   Collection Time: 10/31/16  6:42 PM  Result Value Ref Range   WBC 18.7 (H) 3.6 - 11.0 K/uL   RBC 4.53 3.80 - 5.20 MIL/uL   Hemoglobin 13.3 12.0 - 16.0 g/dL   HCT 39.2 35.0 - 47.0 %   MCV 86.5 80.0 - 100.0 fL   MCH 29.4 26.0 - 34.0 pg   MCHC  34.0 32.0 - 36.0 g/dL   RDW 13.8 11.5 - 14.5 %   Platelets 247 150 - 440 K/uL  Urinalysis, Complete w Microscopic     Status: Abnormal   Collection Time: 10/31/16  8:46 PM  Result Value Ref Range   Color, Urine YELLOW (A) YELLOW   APPearance CLEAR (A) CLEAR   Specific Gravity, Urine 1.017 1.005 - 1.030   pH 5.0 5.0 - 8.0   Glucose, UA NEGATIVE NEGATIVE mg/dL   Hgb urine dipstick NEGATIVE NEGATIVE   Bilirubin Urine NEGATIVE NEGATIVE   Ketones, ur 5 (A) NEGATIVE mg/dL   Protein, ur NEGATIVE NEGATIVE mg/dL   Nitrite NEGATIVE NEGATIVE   Leukocytes, UA MODERATE (A) NEGATIVE   RBC / HPF 0-5 0 - 5 RBC/hpf   WBC, UA 6-30 0 - 5 WBC/hpf   Bacteria, UA RARE (A) NONE SEEN   Squamous Epithelial / LPF 6-30 (A) NONE SEEN   Mucous PRESENT   Pregnancy, urine POC     Status: None   Collection Time: 10/31/16  8:54 PM  Result Value Ref Range   Preg Test, Ur NEGATIVE NEGATIVE    Comment:        THE SENSITIVITY OF THIS METHODOLOGY IS >24 mIU/mL     Ct Abdomen Pelvis W Contrast  Result Date: 10/31/2016 CLINICAL DATA:  31 y/o F; right lower quadrant abdominal pain and vomiting. In EXAM: CT ABDOMEN AND PELVIS WITH CONTRAST TECHNIQUE: Multidetector CT imaging of the abdomen and pelvis was performed using the standard protocol following bolus administration of intravenous contrast. CONTRAST:  189m ISOVUE-300 IOPAMIDOL (ISOVUE-300) INJECTION 61% COMPARISON:  None. FINDINGS: Lower chest: No acute abnormality. Hepatobiliary: No focal liver abnormality is seen. No gallstones, gallbladder wall thickening, or biliary dilatation. Pancreas: Unremarkable. No pancreatic ductal dilatation or surrounding inflammatory changes. Spleen: Normal in size without focal abnormality. Adrenals/Urinary Tract: Adrenal glands are unremarkable. Kidneys are normal, without renal calculi, focal lesion, or hydronephrosis. Bladder is unremarkable. Stomach/Bowel: The appendix is enlarged measuring up to 14 mm, demonstrates wall enhancement, and there is periappendiceal inflammatory changes. No evidence for perforation or abscess at this time. The small and large bowel is otherwise unremarkable. Tiny hiatal hernia of the stomach. Vascular/Lymphatic: No significant vascular findings are present. No enlarged abdominal or pelvic lymph nodes. Reproductive: Uterus and bilateral adnexa are unremarkable. Other: No abdominal wall hernia or abnormality. No abdominopelvic ascites. Musculoskeletal: No acute or significant osseous findings. IMPRESSION: Acute appendicitis. Retrocecal appendix. No evidence for perforation or abscess at this time. Electronically Signed   By: LKristine GarbeM.D.   On: 10/31/2016 23:42    Review of Systems  Constitutional: Positive for chills, fever and malaise/fatigue.  HENT: Negative for congestion and sore throat.   Respiratory: Negative for cough and shortness of breath.   Cardiovascular: Negative for chest pain and leg swelling.  Gastrointestinal:  Positive for abdominal pain, nausea and vomiting.  Genitourinary: Negative for dysuria, flank pain, frequency and hematuria.  Musculoskeletal: Negative for back pain, joint pain and neck pain.  Skin: Negative for itching and rash.  Neurological: Positive for weakness. Negative for dizziness and loss of consciousness.  Psychiatric/Behavioral: Negative for depression. The patient is not nervous/anxious.   All other systems reviewed and are negative.   Blood pressure (!) 94/46, pulse 68, temperature 98.1 F (36.7 C), temperature source Oral, resp. rate 16, height '5\' 6"'  (1.676 m), weight 175 lb (79.4 kg), SpO2 98 %, currently breastfeeding. Physical Exam  Vitals reviewed. Constitutional: She is oriented to person, place,  and time. She appears well-developed and well-nourished. No distress.  HENT:  Head: Normocephalic and atraumatic.  Right Ear: External ear normal.  Left Ear: External ear normal.  Nose: Nose normal.  Mouth/Throat: Oropharynx is clear and moist. No oropharyngeal exudate.  Eyes: Conjunctivae and EOM are normal. Pupils are equal, round, and reactive to light. No scleral icterus.  Neck: Normal range of motion. Neck supple. No tracheal deviation present.  Cardiovascular: Normal rate, regular rhythm, normal heart sounds and intact distal pulses.  Exam reveals no friction rub.   No murmur heard. Respiratory: Effort normal and breath sounds normal. No respiratory distress. She has no wheezes. She has no rales.  GI: Soft. Bowel sounds are normal. She exhibits no distension. There is tenderness. There is no rebound and no guarding.  Mild tenderness in the right lower quadrant at McBurney's point, positive Rovsing sign, no peritoneal signs, negative psoas and negative obturator  Musculoskeletal: Normal range of motion. She exhibits no edema, tenderness or deformity.  Neurological: She is alert and oriented to person, place, and time. No cranial nerve deficit.  Skin: Skin is warm and  dry. No rash noted. No erythema. No pallor.  Psychiatric: She has a normal mood and affect. Her behavior is normal. Judgment and thought content normal.     Assessment/Plan 31 year old female who is otherwise healthy until 6 weeks ago when she had a normal pregnancy and then has had mastitis and now acute appendicitis. I personally reviewed the patient's laboratory values which is her elevated white blood cell count at 18. I've also personally reviewed her CT scan which does show a thickened and inflamed appendix however no stranding further than this no fluid collections or signs of rupture I also reviewed the radiology read.  The risks, benefits, complications, treatment options, and expected outcomes were discussed with the patient. The treatment of antibiotics alone was discussed giving a 20% chance that this could fail and surgery would be necessary.  Also discussed continuing to the operating room for Laparoscopic Appendectomy.  The possibilities of  bleeding, recurrent infection, perforation of viscus, finding a normal appendix, the need for additional procedures, failure to diagnose a condition, potential for drain placement, conversion to open procedure and creating a complication requiring transfusion or further operations were discussed. The patient was given the opportunity to ask questions and have them answered. Since she has a 69-week-old baby at home she would like to attempt the antibiotics and see if she can improve with this alone. I will start her on Zosyn and admitted her to the hospital and will reexamine her carefully and will go to the OR with any worsening or if she does not resolve her symptoms.  Hubbard Robinson, MD 11/01/2016, 3:40 AM

## 2016-11-02 LAB — CBC
HCT: 35.2 % (ref 35.0–47.0)
HEMOGLOBIN: 12.2 g/dL (ref 12.0–16.0)
MCH: 30.3 pg (ref 26.0–34.0)
MCHC: 34.5 g/dL (ref 32.0–36.0)
MCV: 87.8 fL (ref 80.0–100.0)
Platelets: 215 10*3/uL (ref 150–440)
RBC: 4.01 MIL/uL (ref 3.80–5.20)
RDW: 13.9 % (ref 11.5–14.5)
WBC: 4.8 10*3/uL (ref 3.6–11.0)

## 2016-11-02 MED ORDER — ACETAMINOPHEN 325 MG PO TABS: 650.0000 mg | ORAL_TABLET | Freq: Four times a day (QID) | ORAL | Status: DC

## 2016-11-02 MED ORDER — AMOXICILLIN-POT CLAVULANATE 875-125 MG PO TABS
1.0000 | ORAL_TABLET | Freq: Two times a day (BID) | ORAL | Status: DC
  Administered 2016-11-02: 1 via ORAL
  Filled 2016-11-02 (×2): qty 1

## 2016-11-02 MED ORDER — KETOROLAC TROMETHAMINE 30 MG/ML IJ SOLN: 30.0000 mg | Freq: Four times a day (QID) | INTRAMUSCULAR | Status: DC

## 2016-11-02 MED ORDER — AMOXICILLIN-POT CLAVULANATE 875-125 MG PO TABS: 1.0000 | ORAL_TABLET | Freq: Two times a day (BID) | ORAL | 0 refills | Status: DC

## 2016-11-02 NOTE — Discharge Summary (Signed)
Physician Discharge Summary  Patient ID: Daneil DolinKatie Aldridge Roland MRN: [id number] DOB/AGE: 04-27-1986 31 y.o.  Admit date: 10/31/2016 Discharge date: 11/02/2016  Admission Diagnoses: acute appendicitis  Discharge Diagnoses:  Active Problems:   Acute appendicitis   Discharged Condition: good  Hospital Course: 31 yr old female with acute appendicitis 6 weeks postpartum.  She was treated with ABx and now has no pain and tolerating diet.  Will send her home with 10 days abx and f/u in office next week.   Consults: None  Significant Diagnostic Studies: CT scan  Treatments: antibiotics: Zosyn  Discharge Exam: Blood pressure 109/60, pulse (!) 50, temperature 98 F (36.7 C), temperature source Oral, resp. rate 18, height 5\' 6"  (1.676 m), weight 175 lb (79.4 kg), SpO2 98 %, currently breastfeeding. General appearance: alert, cooperative and no distress GI: soft, non-tender; bowel sounds normal; no masses,  no organomegaly Extremities: extremities normal, atraumatic, no cyanosis or edema  Disposition: 01-Home or Self Care  Discharge Instructions    Call MD for:  difficulty breathing, headache or visual disturbances    Complete by:  As directed    Call MD for:  persistant nausea and vomiting    Complete by:  As directed    Call MD for:  redness, tenderness, or signs of infection (pain, swelling, redness, odor or green/yellow discharge around incision site)    Complete by:  As directed    Call MD for:  severe uncontrolled pain    Complete by:  As directed    Call MD for:  temperature >100.4    Complete by:  As directed    Diet general    Complete by:  As directed    Increase activity slowly    Complete by:  As directed    May shower / Bathe    Complete by:  As directed    No dressing needed    Complete by:  As directed      Allergies as of 11/02/2016   No Known Allergies     Medication List    STOP taking these medications   oseltamivir 75 MG capsule Commonly known as:   TAMIFLU   sulfamethoxazole-trimethoprim 800-160 MG tablet Commonly known as:  BACTRIM DS,SEPTRA DS     TAKE these medications   amoxicillin-clavulanate 875-125 MG tablet Commonly known as:  AUGMENTIN Take 1 tablet by mouth every 12 (twelve) hours.   PRENATAL 1 PO Take by mouth daily.      Follow-up Information    Gladis Riffleatherine L Darci Lykins, MD. Schedule an appointment as soon as possible for a visit in 1 week(s).   Specialty:  Surgery Why:  f/u in one week acute appendicitis treated with antibiotics alone Contact information: [address] [place] [address] [phone]           Signed: Gladis RiffleCatherine L Jayvin Hurrell 11/02/2016, 10:10 AM

## 2016-11-02 NOTE — Progress Notes (Signed)
Subjective:  Patient is 6 weeks postpartum and was diagnosed with right breast mastitis on the lateral side. She was started on bactrim 3 days ago.  She was admitted yesterday for acute appendicitis and is being discharged today on Augmentin for  10 day course. She notes significant improvement in discomfort in right breast.  She believes the redness on the breast has come down as well.  Objective: BP 122/73 (BP Location: Left Arm)   Pulse (!) 55   Temp 98.1 F (36.7 C) (Oral)   Resp 18   Ht 5\' 6"  (1.676 m)   Wt 175 lb (79.4 kg)   SpO2 98%   Breastfeeding? Yes Comment: ncp preg. vaginal delivery 6 weeks ago  BMI 28.25 kg/m   Gen: NAD Breast (female chaperone present): area of dark discoloration on right lateral breast.  Size is about 8cm x 8cm.  There is no tenderness or warmth.  The underlying tissue has mild induration. However, when compared to the rest of her right breast parenchyma, the texture is similar.    Assessment and Plan: 31 y.o. 651P1001 female who is about 6-7 weeks postpartum  From an uncomplicated SVD with previously diagnosed right breast mastitis, improving.  She was admitted for acute appendicitis and is being treated conservatively with outpatient Augmentin.  Mastitis may be safely treated with Augmentin, unless it is hospital acquired.  It may not respond if that is the case.  Given how long it has been since she has been in the hospital, the likelihood is low that she would have a hospital acquired MRSA source.  She was given precautions about failing Augmentin therapy.  If her symptoms return or worsen, [facility].  Thomasene MohairStephen Shalicia Craghead, MD 11/02/2016 2:00 PM

## 2016-11-02 NOTE — Progress Notes (Signed)
Pt discharged home.  Discharge instructions, prescriptions and follow up appointment given to and reviewed with pt.  Pt verbalized understanding.  Escorted by auxillary. 

## 2016-11-02 NOTE — Progress Notes (Signed)
  November 02, 2016  Patient: Joanna DolinKatie Aldridge Lynch  Date of Birth: 17-Jun-1986  Date of Visit: 10/31/2016    To Whom It May Concern:  [name] was seen and treated in our hospital 10/31/2016-11/02/16. Tammi Haig [name].  Sincerely,  Harless LittenKendra Gredmarie Delange, RN

## 2016-11-02 NOTE — Lactation Note (Signed)
Lactation Consultation Note  Patient Name: Daneil DolinKatie Aldridge Gruenberg ZHYQM'VToday's Date: 11/02/2016     Maternal Data  Pt concerned about the effect that augmenten may have through her milk on her baby.  THis drug is is an Lactation risk L1 and is considered safe for breastfeeding , I gave her a copy of this information from   Medications and Mothers' Milk by Bobbye Mortonhomas Hale.  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  Informed how she could start to wean baby from breast to bottle as she has already been doing this while being in the hospital and continuing to pump.  We would be happy to support her in the decision she makes.       [facility] Score/Interventions                      Lactation Tools Discussed/Used     Consult Status      Dyann KiefMarsha D Hiawatha Merriott 11/02/2016, 4:52 PM

## 2016-11-09 ENCOUNTER — Ambulatory Visit: Payer: Self-pay | Admitting: Surgery

## 2016-11-12 ENCOUNTER — Other Ambulatory Visit: Payer: Self-pay

## 2016-11-13 ENCOUNTER — Encounter: Payer: Self-pay | Admitting: Surgery

## 2016-11-13 ENCOUNTER — Ambulatory Visit (INDEPENDENT_AMBULATORY_CARE_PROVIDER_SITE_OTHER): Payer: BC Managed Care – PPO | Admitting: Surgery

## 2016-11-13 VITALS — BP 120/82 | HR 64 | Temp 98.3°F | Ht 67.0 in | Wt 181.0 lb

## 2016-11-13 DIAGNOSIS — K3580 Unspecified acute appendicitis: Secondary | ICD-10-CM | POA: Diagnosis not present

## 2016-11-13 NOTE — Patient Instructions (Signed)
Please give us a call if you have any questions or concerns. 

## 2016-11-15 NOTE — Progress Notes (Signed)
Outpatient Surgical Follow Up  11/15/2016  Joanna LewKatie Lynch Joanna Lynch is an 31 y.o. female.   Chief Complaint  Patient presents with  . Follow-up    Acute Appendicitis    HPI: Joanna Lynch 31 yo w recent episode of acute appendicitis managed with antibiotics. She had an excellent response. Currently is asymptomatic, taking PO, Passing gas and is afebrile.  No past medical history on file.  Past Surgical History:  Procedure Laterality Date  . NASAL SEPTUM SURGERY  2005    Family History  Problem Relation Age of Onset  . Healthy Mother   . Hypertension Father   . Cancer Maternal Aunt     esophagus    Social History:  reports that she has never smoked. She has never used smokeless tobacco. She reports that she does not drink alcohol or use drugs.  Allergies: No Known Allergies  Medications reviewed.    ROS Full ROS performed and is otherwise negative other than what is stated in HPI   BP 120/82   Pulse 64   Temp 98.3 F (36.8 C) (Oral)   Ht 5\' 7"  (1.702 m)   Wt 82.1 kg (181 lb)   BMI 28.35 kg/m   Physical Exam  Constitutional: She is oriented to person, place, and time and well-developed, well-nourished, and in no distress. No distress.  Neck: Normal range of motion. Neck supple. No JVD present.  Cardiovascular: Normal rate and intact distal pulses.   Pulmonary/Chest: Effort normal. No respiratory distress.  Abdominal: Soft. She exhibits no distension. There is no tenderness. There is no rebound and no guarding.  Musculoskeletal: Normal range of motion. She exhibits no edema.  Neurological: She is alert and oriented to person, place, and time. Gait normal. GCS score is 15.  Skin: Skin is warm and dry.  Psychiatric: Mood, memory, affect and judgment normal.  Nursing note and vitals reviewed.      No results found for this or any previous visit (from the past 48 hour(s)). No results found.  Assessment/Plan: 31 yo recent appendicitis managed medically. D/W her about  the role of interval appendectomy. Pros and cons d/w the pt in detail. Upon discussion she stated that her father had an appendectomy and apparently there was a polyp. Given this fact I encourage her to find out more about the final pathology on her father's appendix and if in fact it was a carcinoid I would probably recommend strongly for an elective appendectomy. At this point she will gather the information and will call us back in a few weeks.  D/W her about potential for recurrence and occult malignancy . She understands. At this time she is not ready for an elective appendectomy. Quenton Fetter Tiffany Talarico, MD FACS General Surgeon  11/15/2016,1:46 PM

## 2017-02-26 ENCOUNTER — Telehealth: Payer: Self-pay

## 2017-02-26 NOTE — Telephone Encounter (Signed)
Patient called wanting to know if she needed to schedule a post partum visit since she delivered in December and never had a pp visit. Left msg for pt that she may call to schedule an annual exam since last pap was 12/2015 if one was needed and to do a physical exam and discuss birth control if desired.

## 2017-04-20 IMAGING — CT CT ABD-PELV W/ CM
2 of 5 series · 15 of 46 positions shown, 17 images · IV contrast (APPLIED)
Comparison: None.

CLINICAL DATA: 30 y/o F; right lower quadrant abdominal pain and
vomiting. In

EXAM:
CT ABDOMEN AND PELVIS WITH CONTRAST
TECHNIQUE: Multidetector CT imaging of the abdomen and pelvis was performed
using the standard protocol following bolus administration of
intravenous contrast.
CONTRAST:  100mL QFPZ9D-[id number] IOPAMIDOL (QFPZ9D-[id number]) INJECTION 61%

[Series 2: routine abd/pel with · axial · 0.82mm/px · z∈[-718,-273]mm · 12 of 99 slices shown, 14 images]
[im 5/99  soft-tissue]
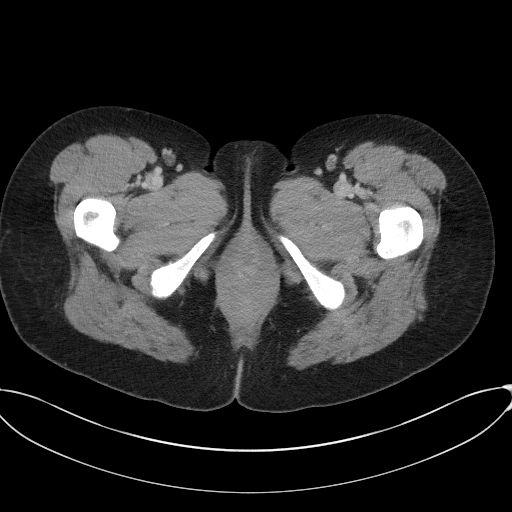
[im 5/99  bone]
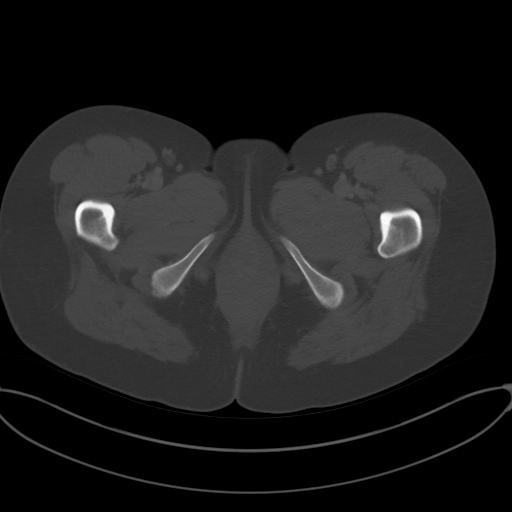
[im 15/99  soft-tissue]
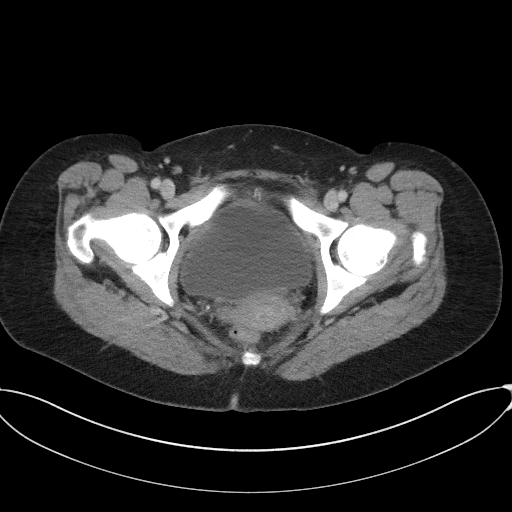
[im 20/99  soft-tissue]
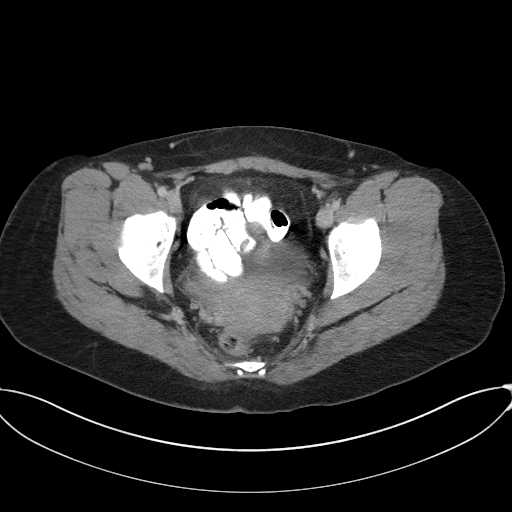
[im 30/99  soft-tissue]
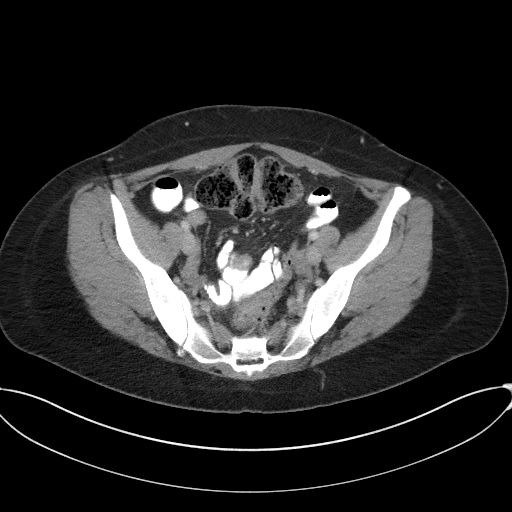
[im 40/99  soft-tissue]
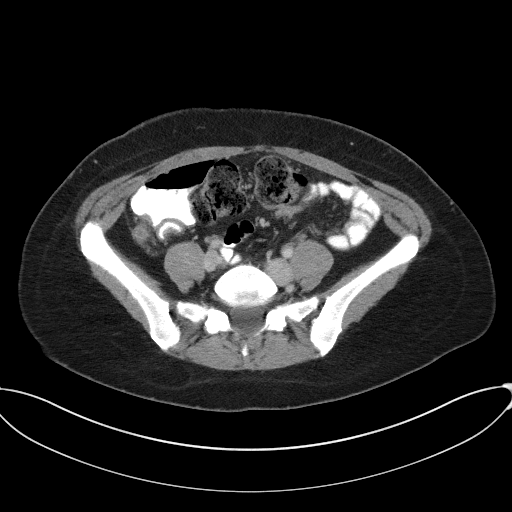
[im 45/99  soft-tissue]
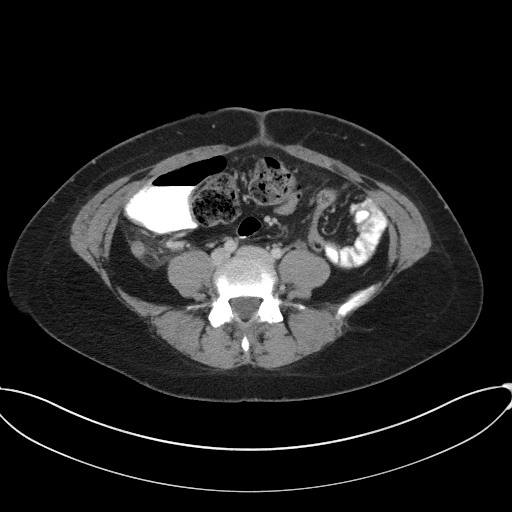
[im 54/99  soft-tissue]
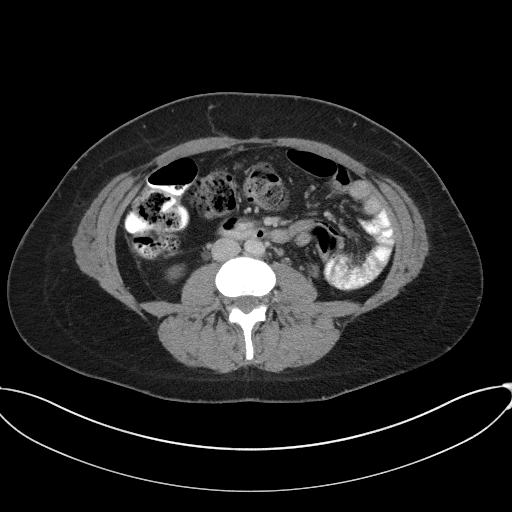
[im 59/99  soft-tissue]
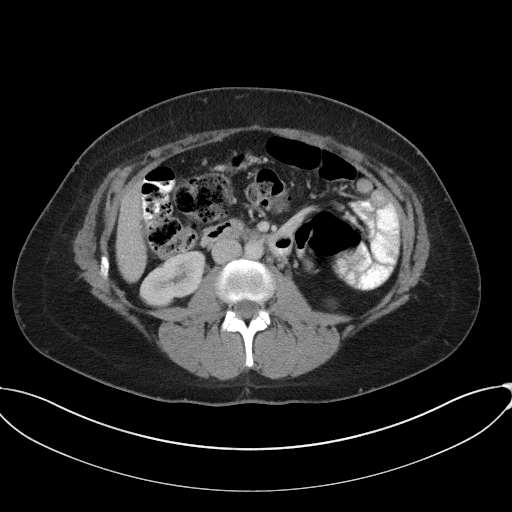
[im 69/99  soft-tissue]
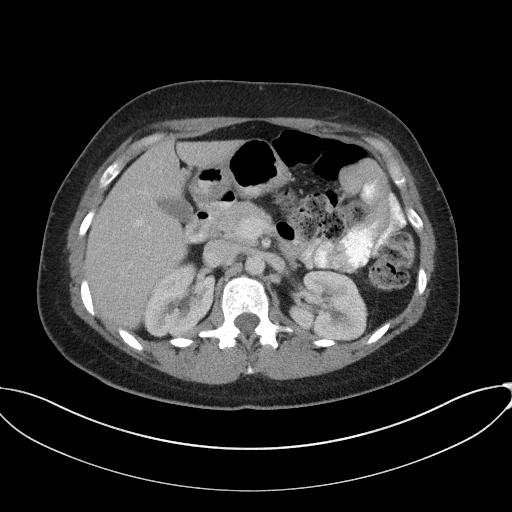
[im 69/99  bone]
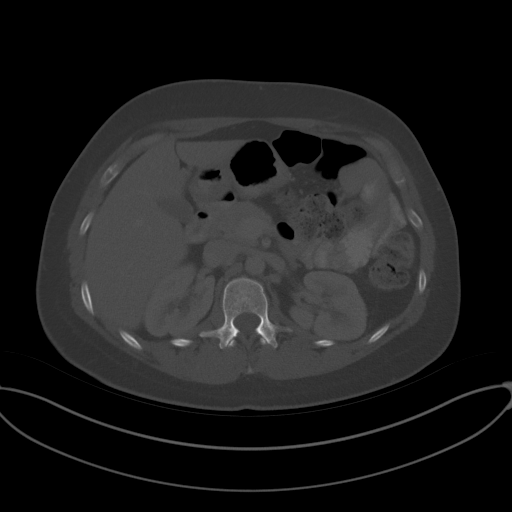
[im 79/99  soft-tissue]
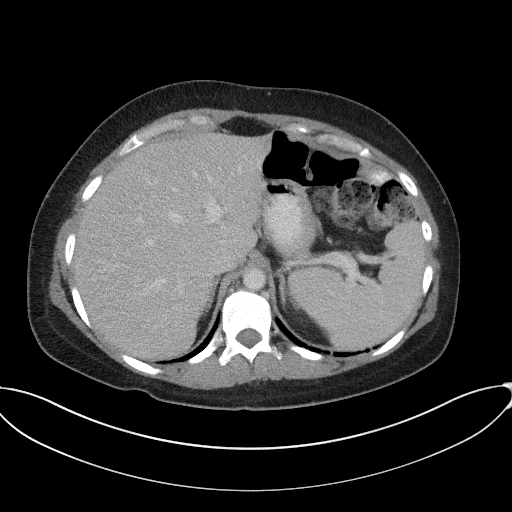
[im 84/99  soft-tissue]
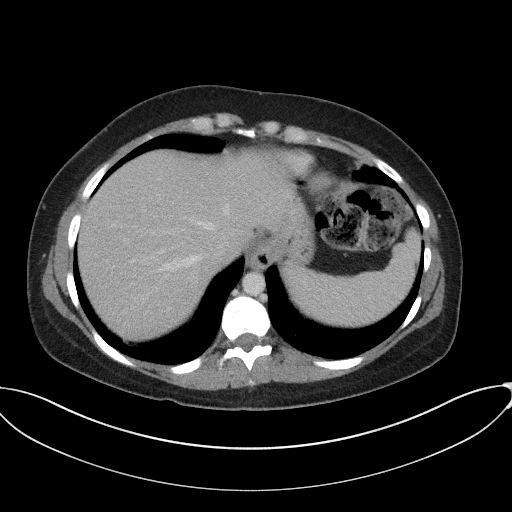
[im 94/99  soft-tissue]
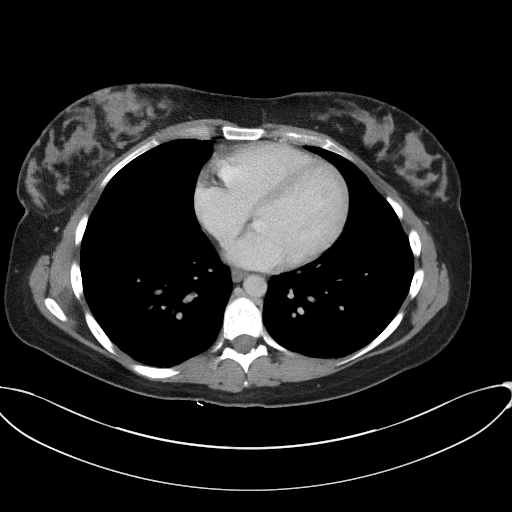

[Series 5: coronal st · coronal · 0.68mm/px · 3 of 91 slices shown]
[im 31/91  soft-tissue]
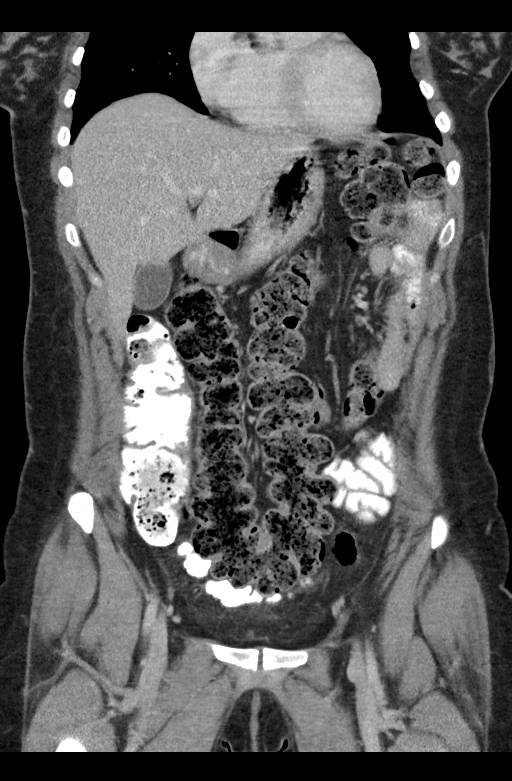
[im 41/91  soft-tissue]
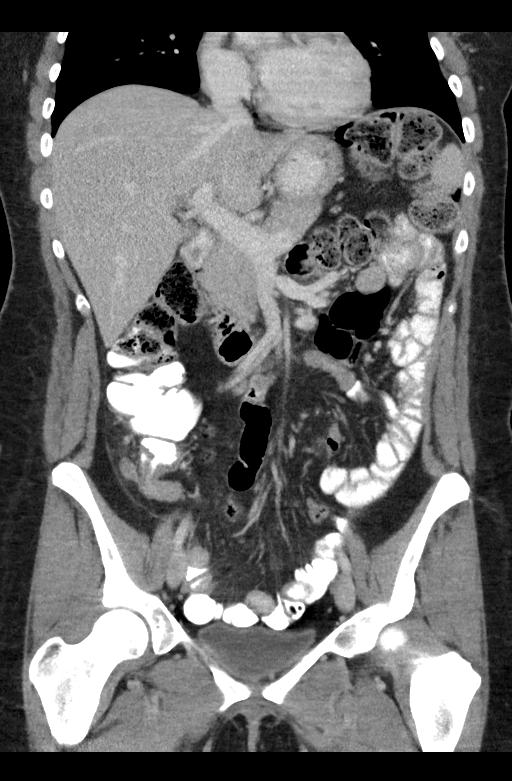
[im 51/91  soft-tissue]
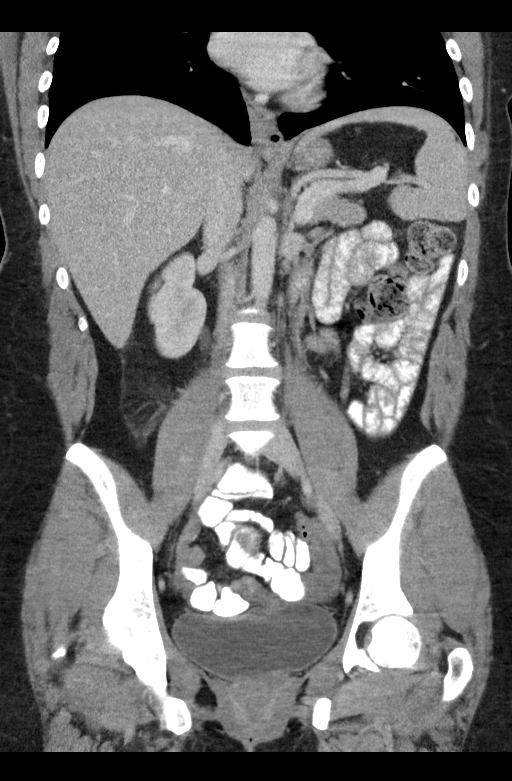

[15 of 46 positions shown; findings below may reference images not displayed]

FINDINGS: Lower chest: No acute abnormality.

Hepatobiliary: No focal liver abnormality is seen. No gallstones,
gallbladder wall thickening, or biliary dilatation.

Pancreas: Unremarkable. No pancreatic ductal dilatation or
surrounding inflammatory changes.

Spleen: Normal in size without focal abnormality.

Adrenals/Urinary Tract: Adrenal glands are unremarkable. Kidneys are
normal, without renal calculi, focal lesion, or hydronephrosis.
Bladder is unremarkable.

Stomach/Bowel: The appendix is enlarged measuring up to 14 mm,
demonstrates wall enhancement, and there is periappendiceal
inflammatory changes. No evidence for perforation or abscess at this
time.

The small and large bowel is otherwise unremarkable. Tiny hiatal
hernia of the stomach.

Vascular/Lymphatic: No significant vascular findings are present. No
enlarged abdominal or pelvic lymph nodes.

Reproductive: Uterus and bilateral adnexa are unremarkable.

Other: No abdominal wall hernia or abnormality. No abdominopelvic
ascites.

Musculoskeletal: No acute or significant osseous findings.
IMPRESSION: Acute appendicitis. Retrocecal appendix. No evidence for perforation
or abscess at this time.

By: Llewellyn Chu M.D.

## 2017-07-23 ENCOUNTER — Ambulatory Visit (INDEPENDENT_AMBULATORY_CARE_PROVIDER_SITE_OTHER): Payer: BC Managed Care – PPO | Admitting: Certified Nurse Midwife

## 2017-07-23 ENCOUNTER — Encounter: Payer: Self-pay | Admitting: Certified Nurse Midwife

## 2017-07-23 DIAGNOSIS — N926 Irregular menstruation, unspecified: Secondary | ICD-10-CM

## 2017-07-23 LAB — POCT URINE PREGNANCY: Preg Test, Ur: POSITIVE — AB

## 2017-07-23 NOTE — Progress Notes (Signed)
Subjective:    Rovena Hearld is a 31 y.o. female who presents for evaluation of amenorrhea. She believes she could be pregnant. Pregnancy is desired. Sexual Activity: none. Current symptoms also include: nausea. Last period was normal. Her last pregnancy was less than a year ago   No LMP recorded. Patient is pregnant. The following portions of the patient's history were reviewed and updated as appropriate: allergies, current medications, past family history, past medical history, past social history, past surgical history and problem list.  Review of Systems Pertinent items are noted in HPI.     Objective:    There were no vitals taken for this visit. General: alert, cooperative, appears stated age and no acute distress    Lab Review Urine HCG: positive    Assessment:    Absence of menstruation.     Plan:    Pregnancy Test:   Positive: EDC: 03/05/18. Briefly discussed pre-natal care options. Pregnancy, Childbirth and the Newborn book given. Encouraged well-balanced diet, plenty of rest when needed, pre-natal vitamins daily and walking for exercise. Discussed self-help for nausea, avoiding OTC medications until consulting provider or pharmacist, other than Tylenol as needed, minimal caffeine (1-2 cups daily) and avoiding alcohol. She will schedule her initial nurse visit at 10 wks , NOB physical exam @ 12 wks. . Feel free to call with any questions.  Doreene Burke, CNM

## 2017-07-23 NOTE — Patient Instructions (Signed)

## 2017-07-24 ENCOUNTER — Ambulatory Visit (INDEPENDENT_AMBULATORY_CARE_PROVIDER_SITE_OTHER): Payer: BC Managed Care – PPO

## 2017-07-24 ENCOUNTER — Encounter: Payer: Self-pay | Admitting: Certified Nurse Midwife

## 2017-07-24 DIAGNOSIS — N926 Irregular menstruation, unspecified: Secondary | ICD-10-CM | POA: Diagnosis not present

## 2017-08-09 ENCOUNTER — Ambulatory Visit (INDEPENDENT_AMBULATORY_CARE_PROVIDER_SITE_OTHER): Payer: BC Managed Care – PPO | Admitting: Certified Nurse Midwife

## 2017-08-09 VITALS — BP 126/77 | HR 73 | Wt 174.4 lb

## 2017-08-09 DIAGNOSIS — Z3481 Encounter for supervision of other normal pregnancy, first trimester: Secondary | ICD-10-CM

## 2017-08-09 DIAGNOSIS — Z1389 Encounter for screening for other disorder: Secondary | ICD-10-CM

## 2017-08-09 DIAGNOSIS — Z113 Encounter for screening for infections with a predominantly sexual mode of transmission: Secondary | ICD-10-CM

## 2017-08-09 NOTE — Progress Notes (Signed)
Joanna Lynch presents for NOB nurse interview visit. Pregnancy confirmation done on 07/23/2017. UPT-positive. LMP: 05/29/2017.  Ultrasound 07/24/2017 for dating and viability which is consistent with LMP.  G-2.  P-1001. Pregnancy education material explained and given. No cats in the home. NOB labs ordered. HIV labs and Drug screen were explained optional and she did not decline. Drug screen ordered. PNV encouraged. Genetic screening options discussed. Genetic testing: Declined.  Pt may discuss with provider. Pt. To follow up with provider on 08/23/2017 for NOB physical.  All questions answered.

## 2017-08-09 NOTE — Patient Instructions (Signed)
Pregnancy and Zika Virus Disease Zika virus disease, or Zika, is an illness that can spread to people from mosquitoes that carry the virus. It may also spread from person to person through infected body fluids. Zika first occurred in [place], but recently it has spread to new areas. The virus occurs in tropical climates. The location of Zika continues to change. Most people who become infected with Zika virus do not develop serious illness. However, Zika may cause birth defects in an unborn baby whose mother is infected with the virus. It may also increase the risk of miscarriage. What are the symptoms of Zika virus disease? In many cases, people who have been infected with Zika virus do not develop any symptoms. If symptoms appear, they usually start about a week after the person is infected. Symptoms are usually mild. They may include:  Fever.  Rash.  Red eyes.  Joint pain.  How does Zika virus disease spread? The main way that Zika virus spreads is through the bite of a certain type of mosquito. Unlike most types of mosquitos, which bite only at night, the type of mosquito that carries Zika virus bites both at night and during the day. Zika virus can also spread through sexual contact, through a blood transfusion, and from a mother to her baby before or during birth. Once you have had Zika virus disease, it is unlikely that you will get it again. Can I pass Zika to my baby during pregnancy? Yes, Zika can pass from a mother to her baby before or during birth. What problems can Zika cause for my baby? A woman who is infected with Zika virus while pregnant is at risk of having her baby born with a condition in which the brain or head is smaller than expected (microcephaly). Babies who have microcephaly can have developmental delays, seizures, hearing problems, and vision problems. Having Zika virus disease during pregnancy can also increase the risk of miscarriage. How can Zika virus disease be  prevented? There is no vaccine to prevent Zika. The best way to prevent the disease is to avoid infected mosquitoes and avoid exposure to body fluids that can spread the virus. Avoid any possible exposure to Zika by taking the following precautions. For women and their sex partners:  Avoid traveling to high-risk areas. The locations where Zika is being reported change often. To identify high-risk areas, check the CDC travel website: www.cdc.gov/zika/geo/index.html  If you or your sex partner must travel to a high-risk area, talk with a health care provider before and after traveling.  Take all precautions to avoid mosquito bites if you live in, or travel to, any of the high-risk areas. Insect repellents are safe to use during pregnancy.  Ask your health care provider when it is safe to have sexual contact.  For women:  If you are pregnant or trying to become pregnant, avoid sexual contact with persons who may have been exposed to Zika virus, persons who have possible symptoms of Zika, or persons whose history you are unsure about. If you choose to have sexual contact with someone who may have been exposed to Zika virus, use condoms correctly during the entire duration of sexual activity, every time. Do not share sexual devices, as you may be exposed to body fluids.  Ask your health care provider about when it is safe to attempt pregnancy after a possible exposure to Zika virus.  What steps should I take to avoid mosquito bites? Take these steps to avoid mosquito bites   when you are in a high-risk area:  Wear loose clothing that covers your arms and legs.  Limit your outdoor activities.  Do not open windows unless they have window screens.  Sleep under mosquito nets.  Use insect repellent. The best insect repellents have:  DEET, picaridin, oil of lemon eucalyptus (OLE), or IR3535 in them.  Higher amounts of an active ingredient in them.  Remember that insect repellents are safe to  use during pregnancy.  Do not use OLE on children who are younger than 3 years of age. Do not use insect repellent on babies who are younger than 2 months of age.  Cover your child's stroller with mosquito netting. Make sure the netting fits snugly and that any loose netting does not cover your child's mouth or nose. Do not use a blanket as a mosquito-protection cover.  Do not apply insect repellent underneath clothing.  If you are using sunscreen, apply the sunscreen before applying the insect repellent.  Treat clothing with permethrin. Do not apply permethrin directly to your skin. Follow label directions for safe use.  Get rid of standing water, where mosquitoes may reproduce. Standing water is often found in items such as buckets, bowls, animal food dishes, and flowerpots.  When you return from traveling to any high-risk area, continue taking actions to protect yourself against mosquito bites for 3 weeks, even if you show no signs of illness. This will prevent spreading Zika virus to uninfected mosquitoes. What should I know about the sexual transmission of Zika? People can spread Zika to their sexual partners during vaginal, anal, or oral sex, or by sharing sexual devices. Many people with Zika do not develop symptoms, so a person could spread the disease without knowing that they are infected. The greatest risk is to women who are pregnant or who may become pregnant. Zika virus can live longer in semen than it can live in blood. Couples can prevent sexual transmission of the virus by:  Using condoms correctly during the entire duration of sexual activity, every time. This includes vaginal, anal, and oral sex.  Not sharing sexual devices. Sharing increases your risk of being exposed to body fluid from another person.  Avoiding all sexual activity until your health care provider says it is safe.  Should I be tested for Zika virus? A sample of your blood can be tested for Zika virus. A  pregnant woman should be tested if she may have been exposed to the virus or if she has symptoms of Zika. She may also have additional tests done during her pregnancy, such ultrasound testing. Talk with your health care provider about which tests are recommended. This information is not intended to replace advice given to you by your health care provider. Make sure you discuss any questions you have with your health care provider. Document Released: 06/22/2015 Document Revised: 03/08/2016 Document Reviewed: 06/15/2015 Elsevier Interactive Patient Education  2018 [employer] Hyperemesis Gravidarum Hyperemesis gravidarum is a severe form of nausea and vomiting that happens during pregnancy. Hyperemesis is worse than morning sickness. It may cause you to have nausea or vomiting all day for many days. It may keep you from eating and drinking enough food and liquids. Hyperemesis usually occurs during the first half (the first 20 weeks) of pregnancy. It often goes away once a woman is in her second half of pregnancy. However, sometimes hyperemesis continues through an entire pregnancy. What are the causes? The cause of this condition is not known. It may be related   to changes in chemicals (hormones) in the body during pregnancy, such as the high level of pregnancy hormone (human chorionic gonadotropin) or the increase in the female sex hormone (estrogen). What are the signs or symptoms? Symptoms of this condition include:  Severe nausea and vomiting.  Nausea that does not go away.  Vomiting that does not allow you to keep any food down.  Weight loss.  Body fluid loss (dehydration).  Having no desire to eat, or not liking food that you have previously enjoyed.  How is this diagnosed? This condition may be diagnosed based on:  A physical exam.  Your medical history.  Your symptoms.  Blood tests.  Urine tests.  How is this treated? This condition may be managed with medicine. If  medicines to do not help relieve nausea and vomiting, you may need to receive fluids through an IV tube at the hospital. Follow these instructions at home:  Take over-the-counter and prescription medicines only as told by your health care provider.  Avoid iron pills and multivitamins that contain iron for the first 3-4 months of pregnancy. If you take prescription iron pills, do not stop taking them unless your health care provider approves.  Take the following actions to help prevent nausea and vomiting: ? In the morning, before getting out of bed, try eating a couple of dry crackers or a piece of toast. ? Avoid foods and smells that upset your stomach. Fatty and spicy foods may make nausea worse. ? Eat 5-6 small meals a day. ? Do not drink fluids while eating meals. Drink between meals. ? Eat or suck on things that have ginger in them. Ginger can help relieve nausea. ? Avoid food preparation. The smell of food can spoil your appetite or trigger nausea.  Follow instructions from your health care provider about eating or drinking restrictions.  For snacks, eat high-protein foods, such as cheese.  Keep all follow-up and pre-birth (prenatal) visits as told by your health care provider. This is important. Contact a health care provider if:  You have pain in your abdomen.  You have a severe headache.  You have vision problems.  You are losing weight. Get help right away if:  You cannot drink fluids without vomiting.  You vomit blood.  You have constant nausea and vomiting.  You are very weak.  You are very thirsty.  You feel dizzy.  You faint.  You have a fever or other symptoms that last for more than 2-3 days.  You have a fever and your symptoms suddenly get worse. Summary  Hyperemesis gravidarum is a severe form of nausea and vomiting that happens during pregnancy.  Making some changes to your eating habits may help relieve nausea and vomiting.  This condition may  be managed with medicine.  If medicines to do not help relieve nausea and vomiting, you may need to receive fluids through an IV tube at the hospital. This information is not intended to replace advice given to you by your health care provider. Make sure you discuss any questions you have with your health care provider. Document Released: 10/01/2005 Document Revised: 05/30/2016 Document Reviewed: 05/30/2016 Elsevier Interactive Patient Education  2017 [employer] First Trimester of Pregnancy The first trimester of pregnancy is from week 1 until the end of week 13 (months 1 through 3). During this time, your baby will begin to develop inside you. At 6-8 weeks, the eyes and face are formed, and the heartbeat can be seen on ultrasound. At the   end of 12 weeks, all the baby's organs are formed. Prenatal care is all the medical care you receive before the birth of your baby. Make sure you get good prenatal care and follow all of your doctor's instructions. Follow these instructions at home: Medicines  Take over-the-counter and prescription medicines only as told by your doctor. Some medicines are safe and some medicines are not safe during pregnancy.  Take a prenatal vitamin that contains at least 600 micrograms (mcg) of folic acid.  If you have trouble pooping (constipation), take medicine that will make your stool soft (stool softener) if your doctor approves. Eating and drinking  Eat regular, healthy meals.  Your doctor will tell you the amount of weight gain that is right for you.  Avoid raw meat and uncooked cheese.  If you feel sick to your stomach (nauseous) or throw up (vomit): ? Eat 4 or 5 small meals a day instead of 3 large meals. ? Try eating a few soda crackers. ? Drink liquids between meals instead of during meals.  To prevent constipation: ? Eat foods that are high in fiber, like fresh fruits and vegetables, whole grains, and beans. ? Drink enough fluids to keep your pee  (urine) clear or pale yellow. Activity  Exercise only as told by your doctor. Stop exercising if you have cramps or pain in your lower belly (abdomen) or low back.  Do not exercise if it is too hot, too humid, or if you are in a place of great height (high altitude).  Try to avoid standing for long periods of time. Move your legs often if you must stand in one place for a long time.  Avoid heavy lifting.  Wear low-heeled shoes. Sit and stand up straight.  You can have sex unless your doctor tells you not to. Relieving pain and discomfort  Wear a good support bra if your breasts are sore.  Take warm water baths (sitz baths) to soothe pain or discomfort caused by hemorrhoids. Use hemorrhoid cream if your doctor says it is okay.  Rest with your legs raised if you have leg cramps or low back pain.  If you have puffy, bulging veins (varicose veins) in your legs: ? Wear support hose or compression stockings as told by your doctor. ? Raise (elevate) your feet for 15 minutes, 3-4 times a day. ? Limit salt in your food. Prenatal care  Schedule your prenatal visits by the twelfth week of pregnancy.  Write down your questions. Take them to your prenatal visits.  Keep all your prenatal visits as told by your doctor. This is important. Safety  Wear your seat belt at all times when driving.  Make a list of emergency phone numbers. The list should include numbers for family, friends, the hospital, and police and fire departments. General instructions  Ask your doctor for a referral to a local prenatal class. Begin classes no later than at the start of month 6 of your pregnancy.  Ask for help if you need counseling or if you need help with nutrition. Your doctor can give you advice or tell you where to go for help.  Do not use hot tubs, steam rooms, or saunas.  Do not douche or use tampons or scented sanitary pads.  Do not cross your legs for long periods of time.  Avoid all herbs  and alcohol. Avoid drugs that are not approved by your doctor.  Do not use any tobacco products, including cigarettes, chewing tobacco, and electronic cigarettes.   If you need help quitting, ask your doctor. You may get counseling or other support to help you quit.  Avoid cat litter boxes and soil used by cats. These carry germs that can cause birth defects in the baby and can cause a loss of your baby (miscarriage) or stillbirth.  Visit your dentist. At home, brush your teeth with a soft toothbrush. Be gentle when you floss. Contact a doctor if:  You are dizzy.  You have mild cramps or pressure in your lower belly.  You have a nagging pain in your belly area.  You continue to feel sick to your stomach, you throw up, or you have watery poop (diarrhea).  You have a bad smelling fluid coming from your vagina.  You have pain when you pee (urinate).  You have increased puffiness (swelling) in your face, hands, legs, or ankles. Get help right away if:  You have a fever.  You are leaking fluid from your vagina.  You have spotting or bleeding from your vagina.  You have very bad belly cramping or pain.  You gain or lose weight rapidly.  You throw up blood. It may look like coffee grounds.  You are around people who have German measles, fifth disease, or chickenpox.  You have a very bad headache.  You have shortness of breath.  You have any kind of trauma, such as from a fall or a car accident. Summary  The first trimester of pregnancy is from week 1 until the end of week 13 (months 1 through 3).  To take care of yourself and your unborn baby, you will need to eat healthy meals, take medicines only if your doctor tells you to do so, and do activities that are safe for you and your baby.  Keep all follow-up visits as told by your doctor. This is important as your doctor will have to ensure that your baby is healthy and growing well. This information is not intended to replace  advice given to you by your health care provider. Make sure you discuss any questions you have with your health care provider. Document Released: 03/19/2008 Document Revised: 10/09/2016 Document Reviewed: 10/09/2016 Elsevier Interactive Patient Education  2017 [employer] Commonly Asked Questions During Pregnancy  Cats: A parasite can be excreted in cat feces.  To avoid exposure you need to have another person empty the little box.  If you must empty the litter box you will need to wear gloves.  Wash your hands after handling your cat.  This parasite can also be found in raw or undercooked meat so this should also be avoided.  Colds, Sore Throats, Flu: Please check your medication sheet to see what you can take for symptoms.  If your symptoms are unrelieved by these medications please call the office.  Dental Work: Most any dental work your dentist recommends is permitted.  X-rays should only be taken during the first trimester if absolutely necessary.  Your abdomen should be shielded with a lead apron during all x-rays.  Please notify your provider prior to receiving any x-rays.  Novocaine is fine; gas is not recommended.  If your dentist requires a note from us prior to dental work please call the office and we will provide one for you.  Exercise: Exercise is an important part of staying healthy during your pregnancy.  You may continue most exercises you were accustomed to prior to pregnancy.  Later in your pregnancy you will most likely notice you have difficulty with activities   requiring balance like riding a bicycle.  It is important that you listen to your body and avoid activities that put you at a higher risk of falling.  Adequate rest and staying well hydrated are a must!  If you have questions about the safety of specific activities ask your provider.    Exposure to Children with illness: Try to avoid obvious exposure; report any symptoms to us when noted,  If you have chicken pos, red  measles or mumps, you should be immune to these diseases.   Please do not take any vaccines while pregnant unless you have checked with your OB provider.  Fetal Movement: After 28 weeks we recommend you do "kick counts" twice daily.  Lie or sit down in a calm quiet environment and count your baby movements "kicks".  You should feel your baby at least 10 times per hour.  If you have not felt 10 kicks within the first hour get up, walk around and have something sweet to eat or drink then repeat for an additional hour.  If count remains less than 10 per hour notify your provider.  Fumigating: Follow your pest control agent's advice as to how long to stay out of your home.  Ventilate the area well before re-entering.  Hemorrhoids:   Most over-the-counter preparations can be used during pregnancy.  Check your medication to see what is safe to use.  It is important to use a stool softener or fiber in your diet and to drink lots of liquids.  If hemorrhoids seem to be getting worse please call the office.   Hot Tubs:  Hot tubs Jacuzzis and saunas are not recommended while pregnant.  These increase your internal body temperature and should be avoided.  Intercourse:  Sexual intercourse is safe during pregnancy as long as you are comfortable, unless otherwise advised by your provider.  Spotting may occur after intercourse; report any bright red bleeding that is heavier than spotting.  Labor:  If you know that you are in labor, please go to the hospital.  If you are unsure, please call the office and let us help you decide what to do.  Lifting, straining, etc:  If your job requires heavy lifting or straining please check with your provider for any limitations.  Generally, you should not lift items heavier than that you can lift simply with your hands and arms (no back muscles)  Painting:  Paint fumes do not harm your pregnancy, but may make you ill and should be avoided if possible.  Latex or water based paints  have less odor than oils.  Use adequate ventilation while painting.  Permanents & Hair Color:  Chemicals in hair dyes are not recommended as they cause increase hair dryness which can increase hair loss during pregnancy.  " Highlighting" and permanents are allowed.  Dye may be absorbed differently and permanents may not hold as well during pregnancy.  Sunbathing:  Use a sunscreen, as skin burns easily during pregnancy.  Drink plenty of fluids; avoid over heating.  Tanning Beds:  Because their possible side effects are still unknown, tanning beds are not recommended.  Ultrasound Scans:  Routine ultrasounds are performed at approximately 20 weeks.  You will be able to see your baby's general anatomy an if you would like to know the gender this can usually be determined as well.  If it is questionable when you conceived you may also receive an ultrasound early in your pregnancy for dating purposes.  Otherwise ultrasound exams   are not routinely performed unless there is a medical necessity.  Although you can request a scan we ask that you pay for it when conducted because insurance does not cover " patient request" scans.  Work: If your pregnancy proceeds without complications you may work until your due date, unless your physician or employer advises otherwise.  Round Ligament Pain/Pelvic Discomfort:  Sharp, shooting pains not associated with bleeding are fairly common, usually occurring in the second trimester of pregnancy.  They tend to be worse when standing up or when you remain standing for long periods of time.  These are the result of pressure of certain pelvic ligaments called "round ligaments".  Rest, Tylenol and heat seem to be the most effective relief.  As the womb and fetus grow, they rise out of the pelvis and the discomfort improves.  Please notify the office if your pain seems different than that described.  It may represent a more serious condition.   

## 2017-08-10 LAB — ABO

## 2017-08-10 LAB — CBC WITH DIFFERENTIAL/PLATELET
BASOS ABS: 0 10*3/uL (ref 0.0–0.2)
Basos: 0 %
EOS (ABSOLUTE): 0.2 10*3/uL (ref 0.0–0.4)
Eos: 2 %
HEMOGLOBIN: 13.4 g/dL (ref 11.1–15.9)
Hematocrit: 38.3 % (ref 34.0–46.6)
IMMATURE GRANS (ABS): 0 10*3/uL (ref 0.0–0.1)
IMMATURE GRANULOCYTES: 0 %
LYMPHS: 26 %
Lymphocytes Absolute: 2.4 10*3/uL (ref 0.7–3.1)
MCH: 30.8 pg (ref 26.6–33.0)
MCHC: 35 g/dL (ref 31.5–35.7)
MCV: 88 fL (ref 79–97)
MONOCYTES: 7 %
Monocytes Absolute: 0.6 10*3/uL (ref 0.1–0.9)
NEUTROS ABS: 6.1 10*3/uL (ref 1.4–7.0)
NEUTROS PCT: 65 %
PLATELETS: 240 10*3/uL (ref 150–379)
RBC: 4.35 x10E6/uL (ref 3.77–5.28)
RDW: 13.3 % (ref 12.3–15.4)
WBC: 9.3 10*3/uL (ref 3.4–10.8)

## 2017-08-10 LAB — MICROSCOPIC EXAMINATION: CASTS: NONE SEEN /LPF

## 2017-08-10 LAB — URINALYSIS, ROUTINE W REFLEX MICROSCOPIC
Bilirubin, UA: NEGATIVE
GLUCOSE, UA: NEGATIVE
Ketones, UA: NEGATIVE
Nitrite, UA: NEGATIVE
PH UA: 7 (ref 5.0–7.5)
PROTEIN UA: NEGATIVE
RBC, UA: NEGATIVE
Specific Gravity, UA: 1.017 (ref 1.005–1.030)
UUROB: 1 mg/dL (ref 0.2–1.0)

## 2017-08-10 LAB — ANTIBODY SCREEN: ANTIBODY SCREEN: NEGATIVE

## 2017-08-10 LAB — RUBELLA SCREEN: RUBELLA: 1.7 {index} (ref >0.99)

## 2017-08-10 LAB — RPR: RPR: NONREACTIVE

## 2017-08-10 LAB — RH TYPE: RH TYPE: POSITIVE

## 2017-08-10 LAB — HEPATITIS B SURFACE ANTIGEN: Hepatitis B Surface Ag: NEGATIVE

## 2017-08-10 LAB — HIV ANTIBODY (ROUTINE TESTING W REFLEX): HIV SCREEN 4TH GENERATION: NONREACTIVE

## 2017-08-10 LAB — VARICELLA ZOSTER ANTIBODY, IGG: Varicella zoster IgG: 2903 index (ref >165)

## 2017-08-11 LAB — URINE CULTURE, OB REFLEX

## 2017-08-11 LAB — CULTURE, OB URINE

## 2017-08-12 LAB — MONITOR DRUG PROFILE 14(MW)
AMPHETAMINE SCREEN URINE: NEGATIVE ng/mL
BARBITURATE SCREEN URINE: NEGATIVE ng/mL
BENZODIAZEPINE SCREEN, URINE: NEGATIVE ng/mL
Buprenorphine, Urine: NEGATIVE ng/mL
CANNABINOIDS UR QL SCN: NEGATIVE ng/mL
Cocaine (Metab) Scrn, Ur: NEGATIVE ng/mL
Creatinine(Crt), U: 70.4 mg/dL (ref 20.0–300.0)
FENTANYL, URINE: NEGATIVE pg/mL
Meperidine Screen, Urine: NEGATIVE ng/mL
Methadone Screen, Urine: NEGATIVE ng/mL
OXYCODONE+OXYMORPHONE UR QL SCN: NEGATIVE ng/mL
Opiate Scrn, Ur: NEGATIVE ng/mL
PH UR, DRUG SCRN: 6.8 (ref 4.5–8.9)
PROPOXYPHENE SCREEN URINE: NEGATIVE ng/mL
Phencyclidine Qn, Ur: NEGATIVE ng/mL
SPECIFIC GRAVITY: 1.018
TRAMADOL SCREEN, URINE: NEGATIVE ng/mL

## 2017-08-12 LAB — NICOTINE SCREEN, URINE: COTININE UR QL SCN: NEGATIVE ng/mL

## 2017-08-12 LAB — GC/CHLAMYDIA PROBE AMP
Chlamydia trachomatis, NAA: NEGATIVE
NEISSERIA GONORRHOEAE BY PCR: NEGATIVE

## 2017-08-23 ENCOUNTER — Ambulatory Visit (INDEPENDENT_AMBULATORY_CARE_PROVIDER_SITE_OTHER): Payer: BC Managed Care – PPO | Admitting: Certified Nurse Midwife

## 2017-08-23 ENCOUNTER — Other Ambulatory Visit: Payer: Self-pay

## 2017-08-23 ENCOUNTER — Encounter: Payer: Self-pay | Admitting: Certified Nurse Midwife

## 2017-08-23 VITALS — BP 115/76 | HR 69 | Wt 176.1 lb

## 2017-08-23 DIAGNOSIS — Z3481 Encounter for supervision of other normal pregnancy, first trimester: Secondary | ICD-10-CM

## 2017-08-23 LAB — POCT URINALYSIS DIPSTICK
BILIRUBIN UA: NEGATIVE
GLUCOSE UA: NEGATIVE
KETONES UA: NEGATIVE
Leukocytes, UA: NEGATIVE
Nitrite, UA: NEGATIVE
Protein, UA: NEGATIVE
RBC UA: NEGATIVE
Spec Grav, UA: 1.02 (ref 1.010–1.025)
Urobilinogen, UA: 0.2 E.U./dL
pH, UA: 6 (ref 5.0–8.0)

## 2017-08-23 NOTE — Patient Instructions (Signed)

## 2017-08-23 NOTE — Progress Notes (Signed)
NEW OB HISTORY AND PHYSICAL  SUBJECTIVE:       Joanna DolinKatie Aldridge Lynch is a 31 y.o. G2P1000 female, Patient's last menstrual period was 05/29/2017., Estimated Date of Delivery: 03/05/18, 3432w2d, presents today for establishment of Prenatal Care. She has  Nausea but is not needing medication at this time.       Gynecologic History Patient's last menstrual period was 05/29/2017. Normal Contraception: none Last Pap: 2017. Results were: normal per pt. But is unsure of date, requesting records  Obstetric History OB History  Gravida Para Term Preterm AB Living  2 1 1         SAB TAB Ectopic Multiple Live Births        0 1    # Outcome Date GA Lbr Len/2nd Weight Sex Delivery Anes PTL Lv  2 Current           1 Term 09/17/16 1824w6d / 01:56 8 lb 2.2 oz (3.69 kg) F Vag-Spont EPI        Past Medical History:  Diagnosis Date  . Appendicitis 10/2016   no surgery, was given medication    Past Surgical History:  Procedure Laterality Date  . NASAL SEPTUM SURGERY  2005    Current Outpatient Medications on File Prior to Visit  Medication Sig Dispense Refill  . Prenatal MV-Min-Fe Fum-FA-DHA (PRENATAL 1 PO) Take by mouth daily.     No current facility-administered medications on file prior to visit.     No Known Allergies  Social History   Socioeconomic History  . Marital status: Single    Spouse name: Not on file  . Number of children: Not on file  . Years of education: Not on file  . Highest education level: Not on file  Social Needs  . Financial resource strain: Not on file  . Food insecurity - worry: Not on file  . Food insecurity - inability: Not on file  . Transportation needs - medical: Not on file  . Transportation needs - non-medical: Not on file  Occupational History  . Not on file  Tobacco Use  . Smoking status: Never Smoker  . Smokeless tobacco: Never Used  Substance and Sexual Activity  . Alcohol use: No  . Drug use: No  . Sexual activity: Yes    Birth  control/protection: None  Other Topics Concern  . Not on file  Social History Narrative  . Not on file    Family History  Problem Relation Age of Onset  . Healthy Mother   . Hypertension Father   . Cancer Maternal Aunt        esophagus    The following portions of the patient's history were reviewed and updated as appropriate: allergies, current medications, past OB history, past medical history, past surgical history, past family history, past social history, and problem list.    OBJECTIVE: Initial Physical Exam (New OB)  GENERAL APPEARANCE: alert, well appearing, in no apparent distress, oriented to person, place and time HEAD: normocephalic, atraumatic MOUTH: mucous membranes moist, pharynx normal without lesions THYROID: no thyromegaly or masses present BREASTS: no masses noted, no significant tenderness, no palpable axillary nodes, no skin changes, fibrocystic changes LUNGS: clear to auscultation, no wheezes, rales or rhonchi, symmetric air entry HEART: regular rate and rhythm, no murmurs ABDOMEN: soft, nontender, nondistended, no abnormal masses, no epigastric pain and FHT present EXTREMITIES: no redness or tenderness in the calves or thighs, no limitation in range of motion, intact peripheral pulses SKIN: normal coloration and turgor, no  rashes LYMPH NODES: no adenopathy palpable NEUROLOGIC: alert, oriented, normal speech, no focal findings or movement disorder noted  PELVIC EXAM EXTERNAL GENITALIA: normal appearing vulva with no masses, tenderness or lesions VAGINA: no abnormal discharge or lesions CERVIX: no lesions or cervical motion tenderness UTERUS: gravid ADNEXA: no masses palpable and nontender OB EXAM PELVIMETRY: appears adequate RECTUM: exam not indicated  ASSESSMENT: Normal pregnancy  PLAN: New OB counseling: The patient has been given an overview regarding routine prenatal care. Recommendations regarding diet, weight gain, and exercise in pregnancy  were given.Prenatal testing, optional genetic testing, and carrier testing. Pt declines genetic testing. Ultrasound use in pregnancy were reviewed. Benefits of Breast Feeding were discussed. The patient is encouraged to consider nursing her baby post partum. Follow up in 4 wks .   Doreene BurkeAnnie Zalmen Wrightsman, CNM

## 2017-09-20 ENCOUNTER — Ambulatory Visit (INDEPENDENT_AMBULATORY_CARE_PROVIDER_SITE_OTHER): Payer: BC Managed Care – PPO | Admitting: Obstetrics and Gynecology

## 2017-09-20 VITALS — BP 123/75 | HR 72 | Wt 181.0 lb

## 2017-09-20 DIAGNOSIS — Z23 Encounter for immunization: Secondary | ICD-10-CM | POA: Diagnosis not present

## 2017-09-20 DIAGNOSIS — Z3492 Encounter for supervision of normal pregnancy, unspecified, second trimester: Secondary | ICD-10-CM

## 2017-09-20 NOTE — Progress Notes (Signed)
ROB- pt is doing well 

## 2017-09-20 NOTE — Addendum Note (Signed)
Addended by: Rosine BeatLONTZ, Tanaia Hawkey L on: 09/20/2017 04:33 PM   Modules accepted: Orders

## 2017-09-20 NOTE — Progress Notes (Signed)
ROB-doing well, flu vaccine given,anatomy scan next visit

## 2017-10-15 NOTE — L&D Delivery Note (Signed)
      Delivery Note   Joanna Lynch is a 32 y.o. G2P1001 at [redacted]w[redacted]d Estimated Date of Delivery: 03/05/18  PRE-OPERATIVE DIAGNOSIS:  1) [redacted]w[redacted]d pregnancy.   POST-OPERATIVE DIAGNOSIS:  1) [redacted]w[redacted]d pregnancy s/p Vaginal, Spontaneous Post-dates  Delivery Type: Vaginal, Spontaneous    Delivery Anesthesia: Epidural   Labor Complications:   none    ESTIMATED BLOOD LOSS: 250 ml    FINDINGS:   1) female infant, Apgar scores of   8  at 1 minute and 9     at 5 minutes and a birthweight -pending  2) Nuchal cord: present with body cord  SPECIMENS:   PLACENTA:   Appearance: Intact ,3vessel cord, circumvallate appearance,cord blood collected   Removal: Spontaneous      Disposition: Per protocol, then discarded DISPOSITION:  Infant to left in stable condition in the delivery room, with L&D personnel and mother,  NARRATIVE SUMMARY: Labor course:  Ms. Joanna Lynch is a G2P1001 at [redacted]w[redacted]d who presented for induction of labor.  She progressed well in labor with pitocin.  She received the appropriate epidural anesthesia and proceeded to complete dilation. She evidenced good maternal expulsive effort during the second stage. She went on to deliver a viable female infant. The placenta delivered without problems and was noted to be complete. A perineal and vaginal examination was performed. Episiotomy/Lacerations: 1st degree  Episiotomy or lacerations were repaired with 3-0 Vicryl rapide suture. The patient tolerated this well.  \Shanika Creacy,SNM/Paitlyn Mcclatchey,CNM 03/11/2018 7:41 PM

## 2017-10-17 ENCOUNTER — Other Ambulatory Visit: Payer: BC Managed Care – PPO

## 2017-10-17 ENCOUNTER — Encounter: Payer: BC Managed Care – PPO | Admitting: Certified Nurse Midwife

## 2017-10-18 ENCOUNTER — Encounter: Payer: BC Managed Care – PPO | Admitting: Certified Nurse Midwife

## 2017-10-21 ENCOUNTER — Ambulatory Visit (INDEPENDENT_AMBULATORY_CARE_PROVIDER_SITE_OTHER): Payer: BC Managed Care – PPO | Admitting: Certified Nurse Midwife

## 2017-10-21 ENCOUNTER — Ambulatory Visit (INDEPENDENT_AMBULATORY_CARE_PROVIDER_SITE_OTHER): Payer: BC Managed Care – PPO

## 2017-10-21 ENCOUNTER — Encounter: Payer: Self-pay | Admitting: Certified Nurse Midwife

## 2017-10-21 VITALS — BP 126/71 | HR 88 | Wt 182.1 lb

## 2017-10-21 DIAGNOSIS — Z3492 Encounter for supervision of normal pregnancy, unspecified, second trimester: Secondary | ICD-10-CM | POA: Diagnosis not present

## 2017-10-21 DIAGNOSIS — O99619 Diseases of the digestive system complicating pregnancy, unspecified trimester: Secondary | ICD-10-CM

## 2017-10-21 DIAGNOSIS — K219 Gastro-esophageal reflux disease without esophagitis: Secondary | ICD-10-CM

## 2017-10-21 LAB — POCT URINALYSIS DIPSTICK
Bilirubin, UA: NEGATIVE
Blood, UA: NEGATIVE
Glucose, UA: NEGATIVE
Ketones, UA: NEGATIVE
LEUKOCYTES UA: NEGATIVE
NITRITE UA: NEGATIVE
PROTEIN UA: NEGATIVE
Spec Grav, UA: 1.01 (ref 1.010–1.025)
Urobilinogen, UA: 0.2 E.U./dL
pH, UA: 6 (ref 5.0–8.0)

## 2017-10-21 NOTE — Patient Instructions (Addendum)
Exercise During Pregnancy For people of all ages, exercise is an important part of being healthy. Exercise improves heart and lung function and helps to maintain strength, flexibility, and a healthy body weight. Exercise also boosts energy levels and elevates mood. For most women, maintaining an exercise routine throughout pregnancy is recommended. It is only on rare occasions and with certain medical conditions or pregnancy complications that women may be asked to limit or avoid exercise during pregnancy. What are some other benefits to exercising during pregnancy? Along with maintaining strength and flexibility, exercising throughout pregnancy can help to:  Keep strength in muscles that are very important during labor and childbirth.  Decrease low back pain during pregnancy.  Decrease the risk of developing gestational diabetes mellitus (GDM).  Improve blood sugar (glucose) control for women who have GDM.  Decrease the risk of developing preeclampsia. This is a serious condition that causes high blood pressure along with other symptoms, such as swelling and headaches.  Decrease the risk of cesarean delivery.  Speed up the recovery after giving birth.  How often should I exercise? Unless your health care provider gives you different instructions, you should try to exercise on most days or all days of the week. In general, try to exercise with moderate intensity for about 150 minutes per week. This can be spread out across several days, such as exercising for 30 minutes per day on 5 days of each week. You can tell that you are exercising at a moderate intensity if you have a higher heart rate and faster breathing, but you are still able to hold a conversation. What types of moderate-intensity exercise are recommended during pregnancy? There are many types of exercise that are safe for you to do during pregnancy. Unless your health care provider gives you different instructions, do a variety of  exercises that safely increase your heart and breathing (cardiopulmonary) rates and help you to build and maintain muscle strength (strength training). You should always be able to talk in full sentences while exercising during pregnancy. Some examples of exercising that is safe to do during pregnancy include:  Brisk walking or hiking.  Swimming.  Water aerobics.  Riding a stationary bike.  Strength training.  Modified yoga or Pilates. Tell your instructor that you are pregnant. Avoid overstretching and avoid lying on your back for long periods of time.  Running or jogging. Only choose this type of exercise if: ? You ran or jogged regularly before your pregnancy. ? You can run or jog and still talk in complete sentences.  What types of exercise should I not do during pregnancy? Depending on your level of fitness and whether you exercised regularly before your pregnancy, you may be advised to limit vigorous-intensity exercise during your pregnancy. You can tell that you are exercising at a vigorous intensity if you are breathing much harder and faster and cannot hold a conversation while exercising. Some examples of exercising that you should avoid during pregnancy include:  Contact sports.  Activities that place you at risk for falling on or being hit in the belly, such as downhill skiing, water skiing, surfing, rock climbing, cycling, gymnastics, and horseback riding.  Scuba diving.  Sky diving.  Yoga or Pilates in a room that is heated to extreme temperatures ("hot yoga" or "hot Pilates").  Jogging or running, unless you ran or jogged regularly before your pregnancy. While jogging or running, you should always be able to talk in full sentences. Do not run or jog so vigorously  that you are unable to have a conversation.  If you are not used to exercising at elevation (more than 6,000 feet above sea level), do not do so during your pregnancy.  When should I avoid exercising  during pregnancy? Certain medical conditions can make it unsafe to exercise during pregnancy, or they may increase your risk of miscarriage or early labor and birth. Some of these conditions include:  Some types of heart disease.  Some types of lung disease.  Placenta previa. This is when the placenta partially or completely covers the opening of the uterus (cervix).  Frequent bleeding from the vagina during your pregnancy.  Incompetent cervix. This is when your cervix does not remain as tightly closed during pregnancy as it should.  Premature labor.  Ruptured membranes. This is when the protective sac (amniotic sac) opens up and amniotic fluid leaks from your vagina.  Severely low blood count (anemia).  Preeclampsia or pregnancy-caused high blood pressure.  Carrying more than one baby (multiple gestation) and having an additional risk of early labor.  Poorly controlled diabetes.  Being severely underweight or severely overweight.  Intrauterine growth restriction. This is when your baby's growth and development during pregnancy are slower than expected.  Other medical conditions. Ask your health care provider if any apply to you.  What else should I know about exercising during pregnancy? You should take these precautions while exercising during pregnancy:  Avoid overheating. ? Wear loose-fitting, breathable clothes. ? Do not exercise in very high temperatures.  Avoid dehydration. Drink enough water before, during, and after exercise to keep your urine clear or pale yellow.  Avoid overstretching. Because of hormone changes during pregnancy, it is easy to overstretch muscles, tendons, and ligaments during pregnancy.  Start slowly and ask your health care provider to recommend types of exercise that are safe for you, if exercising regularly is new for you.  Pregnancy is not a time for exercising to lose weight. When should I seek medical care? You should stop exercising  and call your health care provider if you have any unusual symptoms, such as:  Mild uterine contractions or abdominal cramping.  Dizziness that does not improve with rest.  When should I seek immediate medical care? You should stop exercising and call your local emergency services (911 in the [place] if you have any unusual symptoms, such as:  Sudden, severe pain in your low back or your belly.  Uterine contractions or abdominal cramping that do not improve with rest.  Chest pain.  Bleeding or fluid leaking from your vagina.  Shortness of breath.  This information is not intended to replace advice given to you by your health care provider. Make sure you discuss any questions you have with your health care provider. Document Released: 10/01/2005 Document Revised: 02/29/2016 Document Reviewed: 12/09/2014 Elsevier Interactive Patient Education  2018 [place]. Abdominal Pain During Pregnancy Belly (abdominal) pain is common during pregnancy. Most of the time, it is not a serious problem. Other times, it can be a sign that something is wrong with the pregnancy. Always tell your doctor if you have belly pain. Follow these instructions at home: Monitor your belly pain for any changes. The following actions may help you feel better:  Do not have sex (intercourse) or put anything in your vagina until you feel better.  Rest until your pain stops.  Drink clear fluids if you feel sick to your stomach (nauseous). Do not eat solid food until you feel better.  Only take medicine  as told by your doctor.  Keep all doctor visits as told.  Get help right away if:  You are bleeding, leaking fluid, or pieces of tissue come out of your vagina.  You have more pain or cramping.  You keep throwing up (vomiting).  You have pain when you pee (urinate) or have blood in your pee.  You have a fever.  You do not feel your baby moving as much.  You feel very weak or feel like passing  out.  You have trouble breathing, with or without belly pain.  You have a very bad headache and belly pain.  You have fluid leaking from your vagina and belly pain.  You keep having watery poop (diarrhea).  Your belly pain does not go away after resting, or the pain gets worse. This information is not intended to replace advice given to you by your health care provider. Make sure you discuss any questions you have with your health care provider. Document Released: 09/19/2009 Document Revised: 05/09/2016 Document Reviewed: 04/30/2013 Elsevier Interactive Patient Education  2018 [place]. Back Pain in Pregnancy Back pain during pregnancy is common. Back pain may be caused by several factors that are related to changes during your pregnancy. Follow these instructions at home: Managing pain, stiffness, and swelling  If directed, apply ice for sudden (acute) back pain. ? Put ice in a plastic bag. ? Place a towel between your skin and the bag. ? Leave the ice on for 20 minutes, 2-3 times per day.  If directed, apply heat to the affected area before you exercise: ? Place a towel between your skin and the heat pack or heating pad. ? Leave the heat on for 20-30 minutes. ? Remove the heat if your skin turns bright red. This is especially important if you are unable to feel pain, heat, or cold. You may have a greater risk of getting burned. Activity  Exercise as told by your health care provider. Exercising is the best way to prevent or manage back pain.  Listen to your body when lifting. If lifting hurts, ask for help or bend your knees. This uses your leg muscles instead of your back muscles.  Squat down when picking up something from the floor. Do not bend over.  Only use bed rest as told by your health care provider. Bed rest should only be used for the most severe episodes of back pain. Standing, Sitting, and Lying Down  Do not stand in one place for long periods of time.  Use  good posture when sitting. Make sure your head rests over your shoulders and is not hanging forward. Use a pillow on your lower back if necessary.  Try sleeping on your side, preferably the left side, with a pillow or two between your legs. If you are sore after a night's rest, your bed may be too soft. A firm mattress may provide more support for your back during pregnancy. General instructions  Do not wear high heels.  Eat a healthy diet. Try to gain weight within your health care provider's recommendations.  Use a maternity girdle, elastic sling, or back brace as told by your health care provider.  Take over-the-counter and prescription medicines only as told by your health care provider.  Keep all follow-up visits as told by your health care provider. This is important. This includes any visits with any specialists, such as a physical therapist. Contact a health care provider if:  Your back pain interferes with your daily  activities.  You have increasing pain in other parts of your body. Get help right away if:  You develop numbness, tingling, weakness, or problems with the use of your arms or legs.  You develop severe back pain that is not controlled with medicine.  You have a sudden change in bowel or bladder control.  You develop shortness of breath, dizziness, or you faint.  You develop nausea, vomiting, or sweating.  You have back pain that is a rhythmic, cramping pain similar to labor pains. Labor pain is usually 1-2 minutes apart, lasts for about 1 minute, and involves a bearing down feeling or pressure in your pelvis.  You have back pain and your water breaks or you have vaginal bleeding.  You have back pain or numbness that travels down your leg.  Your back pain developed after you fell.  You develop pain on one side of your back.  You see blood in your urine.  You develop skin blisters in the area of your back pain. This information is not intended to replace  advice given to you by your health care provider. Make sure you discuss any questions you have with your health care provider. Document Released: 01/09/2006 Document Revised: 03/08/2016 Document Reviewed: 06/15/2015 Elsevier Interactive Patient Education  2018 [employer]. Round Ligament Pain The round ligament is a cord of muscle and tissue that helps to support the uterus. It can become a source of pain during pregnancy if it becomes stretched or twisted as the baby grows. The pain usually begins in the second trimester of pregnancy, and it can come and go until the baby is delivered. It is not a serious problem, and it does not cause harm to the baby. Round ligament pain is usually a short, sharp, and pinching pain, but it can also be a dull, lingering, and aching pain. The pain is felt in the lower side of the abdomen or in the groin. It usually starts deep in the groin and moves up to the outside of the hip area. Pain can occur with:  A sudden change in position.  Rolling over in bed.  Coughing or sneezing.  Physical activity.  Follow these instructions at home: Watch your condition for any changes. Take these steps to help with your pain:  When the pain starts, relax. Then try: ? Sitting down. ? Flexing your knees up to your abdomen. ? Lying on your side with one pillow under your abdomen and another pillow between your legs. ? Sitting in a warm bath for 15-20 minutes or until the pain goes away.  Take over-the-counter and prescription medicines only as told by your health care provider.  Move slowly when you sit and stand.  Avoid long walks if they cause pain.  Stop or lessen your physical activities if they cause pain.  Contact a health care provider if:  Your pain does not go away with treatment.  You feel pain in your back that you did not have before.  Your medicine is not helping. Get help right away if:  You develop a fever or chills.  You develop uterine  contractions.  You develop vaginal bleeding.  You develop nausea or vomiting.  You develop diarrhea.  You have pain when you urinate. This information is not intended to replace advice given to you by your health care provider. Make sure you discuss any questions you have with your health care provider. Document Released: 07/10/2008 Document Revised: 03/08/2016 Document Reviewed: 12/08/2014 Elsevier Interactive Patient Education  2018 [place].  Heartburn During Pregnancy Heartburn is pain or discomfort in the throat or chest. It may cause a burning feeling. It happens when stomach acid moves up into the tube that carries food from your mouth to your stomach (esophagus). Heartburn is common during pregnancy. It usually goes away or gets better after giving birth. Follow these instructions at home: Eating and drinking  Do not drink alcohol while you are pregnant.  Figure out which foods and beverages make you feel worse, and avoid them.  Beverages that you may want to avoid include: ? Coffee and tea (with or without caffeine). ? Energy drinks and sports drinks. ? Bubbly (carbonated) drinks or sodas. ? Citrus fruit juices.  Foods that you may want to avoid include: ? Chocolate and cocoa. ? Peppermint and mint flavorings. ? Garlic, onions, and horseradish. ? Spicy and acidic foods. These include peppers, chili powder, curry powder, vinegar, hot sauces, and barbecue sauce. ? Citrus fruits, such as oranges, lemons, and limes. ? Tomato-based foods, such as red sauce, chili, and salsa. ? Fried and fatty foods, such as donuts, french fries, potato chips, and high-fat dressings. ? High-fat meats, such as hot dogs, cold cuts, sausage, ham, and bacon. ? High-fat dairy items, such as whole milk, butter, and cheese.  Eat small meals often, instead of large meals.  Avoid drinking a lot of liquid with your meals.  Avoid eating meals during the 2-3 hours before you go to bed.  Avoid  lying down right after you eat.  Do not exercise right after you eat. Medicines  Take over-the-counter and prescription medicines only as told by your doctor.  Do not take aspirin, ibuprofen, or other NSAIDs unless your doctor tells you to do that.  Your doctor may tell you to avoid medicines that have sodium bicarbonate in them. General instructions  If told, raise the head of your bed about 6 inches (15 cm). You can do this by putting blocks under the legs. Sleeping with more pillows does not help with heartburn.  Do not use any products that contain nicotine or tobacco, such as cigarettes and e-cigarettes. If you need help quitting, ask your doctor.  Wear loose-fitting clothing.  Try to lower your stress, such as with yoga or meditation. If you need help, ask your doctor.  Stay at a healthy weight. If you are overweight, work with your doctor to safely lose weight.  Keep all follow-up visits as told by your doctor. This is important. Contact a doctor if:  You get new symptoms.  Your symptoms do not get better with treatment.  You have weight loss and you do not know why.  You have trouble swallowing.  You make loud sounds when you breathe (wheeze).  You have a cough that does not go away.  You have heartburn often for more than 2 weeks.  You feel sick to your stomach (nauseous), and this does not get better with treatment.  You are throwing up (vomiting), and this does not get better with treatment.  You have pain in your belly (abdomen). Get help right away if:  You have very bad chest pain that spreads to your arm, neck, or jaw.  You feel sweaty, dizzy, or light-headed.  You have trouble breathing.  You have pain when swallowing.  You throw up and your throw-up looks like blood or coffee grounds.  Your poop (stool) is bloody or black. This information is not intended to replace advice given to you by  your health care provider. Make sure you discuss any  questions you have with your health care provider. Document Released: 11/03/2010 Document Revised: 06/18/2016 Document Reviewed: 06/18/2016 Elsevier Interactive Patient Education  2017 [place]. Common Medications Safe in Pregnancy  Acne:      Constipation:  Benzoyl Peroxide     Colace  Clindamycin      Dulcolax Suppository  Topica Erythromycin     Fibercon  Salicylic Acid      Metamucil         Miralax AVOID:        Senakot   Accutane    Cough:  Retin-A       Cough Drops  Tetracycline      Phenergan w/ Codeine if Rx  Minocycline      Robitussin (Plain & DM)  Antibiotics:     Crabs/Lice:  Ceclor       RID  Cephalosporins    AVOID:  E-Mycins      Kwell  Keflex  Macrobid/Macrodantin   Diarrhea:  Penicillin      Kao-Pectate  Zithromax      Imodium AD         PUSH FLUIDS AVOID:       Cipro     Fever:  Tetracycline      Tylenol (Regular or Extra  Minocycline       Strength)  Levaquin      Extra Strength-Do not          Exceed 8 tabs/24 hrs Caffeine:        <222m/day (equiv. To 1 cup of coffee or  approx. 3 12 oz sodas)         Gas: Cold/Hayfever:       Gas-X  Benadryl      Mylicon  Claritin       Phazyme  **Claritin-D        Chlor-Trimeton    Headaches:  Dimetapp      ASA-Free Excedrin  Drixoral-Non-Drowsy     Cold Compress  Mucinex (Guaifenasin)     Tylenol (Regular or Extra  Sudafed/Sudafed-12 Hour     Strength)  **Sudafed PE Pseudoephedrine   Tylenol Cold & Sinus     Vicks Vapor Rub  Zyrtec  **AVOID if Problems With Blood Pressure         Heartburn: Avoid lying down for at least 1 hour after meals  Aciphex      Maalox     Rash:  Milk of Magnesia     Benadryl    Mylanta       1% Hydrocortisone Cream  Pepcid  Pepcid Complete   Sleep Aids:  Prevacid      Ambien   Prilosec       Benadryl  Rolaids       Chamomile Tea  Tums (Limit 4/day)     Unisom  Zantac       Tylenol PM         Warm milk-add vanilla or  Hemorrhoids:       Sugar for  taste  Anusol/Anusol H.C.  (RX: Analapram 2.5%)  Sugar Substitutes:  Hydrocortisone OTC     Ok in moderation  Preparation H      Tucks        Vaseline lotion applied to tissue with wiping    Herpes:     Throat:  Acyclovir      Oragel  Famvir  Valtrex     Vaccines:  Flu Shot Leg Cramps:       *Gardasil  Benadryl      Hepatitis A         Hepatitis B Nasal Spray:       Pneumovax  Saline Nasal Spray     Polio Booster         Tetanus Nausea:       Tuberculosis test or PPD  Vitamin B6 25 mg TID   AVOID:    Dramamine      *Gardasil  Emetrol       Live Poliovirus  Ginger Root 250 mg QID    MMR (measles, mumps &  High Complex Carbs @ Bedtime    rebella)  Sea Bands-Accupressure    Varicella (Chickenpox)  Unisom 1/2 tab TID     *No known complications           If received before Pain:         Known pregnancy;   Darvocet       Resume series after  Lortab        Delivery  Percocet    Yeast:   Tramadol      Femstat  Tylenol 3      Gyne-lotrimin  Ultram       Monistat  Vicodin           MISC:         All Sunscreens           Hair Coloring/highlights          Insect Repellant's          (Including DEET)         Mystic Tans

## 2017-10-21 NOTE — Progress Notes (Signed)
Pt is here for an ROB with u/s first.

## 2017-10-21 NOTE — Progress Notes (Signed)
Joanna Lynch-Doing well, reports round ligament pain and reflux. Discussed home treatment measures including use of abdominal support and Zantac. Anatomy scan today. Complete and normal findings reviewed with pt and spouse, verbalized understanding. Anticipatory guidance regarding course of prenatal care. Reviewed red flag symptoms and when to call. RTC x 4 weeks for Joanna Lynch with Pattricia BossAnnie or sooner if needed.   ULTRASOUND REPORT  Location: [facility] Date of Service:  10/21/17  Indications: Anatomy Findings:  Singleton intrauterine pregnancy is visualized with FHR at 149 BPM. Biometrics give an (U/S) Gestational age of 32 4/7 weeks and an (U/S) EDD of 03/06/18; this correlates with the clinically established EDD of 03/05/18.  Fetal presentation is variable.  EFW: 364 grams (0lb 13oz). Placenta: Posterior and grade 1.  Placenta is 5.4 cm from cervical os. AFI: WNL subjectively.  Anatomic survey is complete and appears WNL; Gender - Female.   Neither ovary was visualized due to overlying bowel gas. Survey of the adnexa demonstrates no adnexal masses. There is no free peritoneal fluid in the cul de sac.  Impression: 1. 20 4/7 week Viable Singleton Intrauterine pregnancy by U/S. 2. (U/S) EDD is consistent with Clinically established (LMP) EDD of 03/05/18. 3. Normal Anatomy Scan  Recommendations: 1.Clinical correlation with the patient's History and Physical Exam.

## 2017-11-18 ENCOUNTER — Encounter: Payer: Self-pay | Admitting: Certified Nurse Midwife

## 2017-11-18 ENCOUNTER — Ambulatory Visit (INDEPENDENT_AMBULATORY_CARE_PROVIDER_SITE_OTHER): Payer: BC Managed Care – PPO | Admitting: Certified Nurse Midwife

## 2017-11-18 VITALS — BP 114/75 | HR 71 | Wt 188.4 lb

## 2017-11-18 DIAGNOSIS — Z3A24 24 weeks gestation of pregnancy: Secondary | ICD-10-CM

## 2017-11-18 MED ORDER — PANTOPRAZOLE SODIUM 40 MG PO TBEC: 40.0000 mg | DELAYED_RELEASE_TABLET | Freq: Every day | ORAL | 3 refills | Status: DC

## 2017-11-18 NOTE — Progress Notes (Signed)
PT is doing well.  

## 2017-11-18 NOTE — Patient Instructions (Signed)

## 2017-11-18 NOTE — Progress Notes (Signed)
ROB, doing well. Complains of indigestions zantac is not helping. Discussed use of Protonix, she agrees to plan.  Feels good fetal movement and denies contractions. Anticipatory guidance for 28 wks visit. Follow up 4 wks.   Doreene BurkeAnnie Nekeshia Lenhardt, CNM

## 2017-12-16 ENCOUNTER — Ambulatory Visit (INDEPENDENT_AMBULATORY_CARE_PROVIDER_SITE_OTHER): Payer: BC Managed Care – PPO | Admitting: Certified Nurse Midwife

## 2017-12-16 ENCOUNTER — Other Ambulatory Visit: Payer: BC Managed Care – PPO

## 2017-12-16 VITALS — BP 105/70 | HR 77 | Wt 193.4 lb

## 2017-12-16 DIAGNOSIS — Z3483 Encounter for supervision of other normal pregnancy, third trimester: Secondary | ICD-10-CM | POA: Diagnosis not present

## 2017-12-16 DIAGNOSIS — Z23 Encounter for immunization: Secondary | ICD-10-CM

## 2017-12-16 LAB — POCT URINALYSIS DIPSTICK
BILIRUBIN UA: NEGATIVE
Blood, UA: NEGATIVE
Glucose, UA: NEGATIVE
KETONES UA: NEGATIVE
NITRITE UA: NEGATIVE
Protein, UA: NEGATIVE
SPEC GRAV UA: 1.01 (ref 1.010–1.025)
Urobilinogen, UA: 0.2 E.U./dL
pH, UA: 6 (ref 5.0–8.0)

## 2017-12-16 NOTE — Patient Instructions (Addendum)
Immunizations and Pregnancy Immunizations, or vaccines, can help to keep you healthy. They can also protect your baby from some diseases until your baby is old enough to safely receive them. If you are pregnant or you are planning a pregnancy, the vaccines that you need are determined by:  Your age.  Your lifestyle.  Your medical history.  Your travel plans.  Your previous vaccines.  The benefits of receiving immunizations during pregnancy usually outweigh the risks:  When the risk of being exposed to a disease is high.  When infection would pose a risk to you or your unborn baby.  When the vaccine is not likely to cause harm.  Should I receive immunizations before pregnancy? If possible, make sure that your vaccines are up to date before you become pregnant. It is safe and important for you to receive weakened viral and weakened bacterial (inactivated) vaccines, as needed, before you are pregnant. Live viral and live bacterial (attenuated) vaccines should be given 1 month or more before pregnancy. Some examples of attenuated vaccines include:  Live attenuated influenza vaccine (LAIV).  Measles, mumps, and rubella (MMR).  Measles, mumps, rubella, and varicella (MMRV).  Rotavirus (RV5 or RV1).  Smallpox.  Typhoid (Ty21a, oral capsule form of the vaccine).  Varicella (VAR).  Shingles.  Yellow fever (YF).  If you become pregnant within 1 month after you have received an attenuated vaccine, contact your health care provider. Should I receive immunizations during pregnancy? It is safe and important for you to receive inactivated vaccines as needed during pregnancy. Until your baby can receive vaccines, your baby will get some protection from diseases through the vaccines that you receive while you are pregnant. However, some inactivated vaccines have not been thoroughly studied in pregnant women, and at this time, they are not recommended during pregnancy unless the benefits  outweigh the risks. One example is the pneumococcal polysaccharide vaccine (PPSV23). In addition, the human papillomavirus (HPV4 or HPV2) vaccine is not recommended during pregnancy. You should receive inactivated influenza (IIV) and adult tetanus, diphtheria, and acellular pertussis (Tdap) vaccines during your pregnancy. The IIV, which is known as "the flu shot," will protect you and your baby (up to 45 months of age) from some complications and strains of influenza. Pregnant women can receive IIV at any time and during any trimester. The Tdap vaccine will help to prevent whooping cough (pertussis) in you and your baby. You should receive 1 dose of this vaccine during each pregnancy. It is recommended that pregnant women receive this vaccine during the 27th-36th weeks of pregnancy. Usually, attenuated vaccines are not given to pregnant women. There is a possible risk of passing the vaccine virus or bacteria to the unborn baby. If you are pregnant and you received an attenuated vaccine, contact your health care provider. Should I receive immunizations after pregnancy? It is safe and important for you to receive vaccines as needed after pregnancy. This is true even if you are breastfeeding. If you did not receive the Tdap vaccine during your pregnancy, you should receive that vaccine right after you give birth to your baby (delivery). If you are not immune to measles, mumps, rubella, or varicella, you should receive the MMR or MMRV vaccine within days after delivery. Most other vaccines are also safe to receive after pregnancy. What if I am pregnant and I plan to travel internationally? If you are pregnant and you are planning to travel internationally, talk with your health care provider at least 4-6 weeks before your trip.  Discuss precautions or vaccine options. Before you receive vaccines, the risk of disease and immunization should always be determined. Immunizations that are recommended for pregnant  international travelers include:  Hepatitis B (HepB).  IIV.  Tetanus and diphtheria (Td) or Tdap.  Hepatitis A (HepA).  Immunizations that should be delayed or given only when benefits outweigh the risk of disease exposure for pregnant international travelers include:  Japanese encephalitis (JE).  Meningococcal meningitis (MPSV4 or MCV4).  PPSV23.  Inactivated polio (IPV).  Rabies.  Typhoid.  YF.  Immunizations that should not be given to pregnant international travelers include:  Tuberculosis (BCG).  MMR.  MMRV.  HPV4 or HPV2.  VAR.  LAIV.  This information is not intended to replace advice given to you by your health care provider. Make sure you discuss any questions you have with your health care provider. Document Released: 10/21/2007 Document Revised: 03/02/2016 Document Reviewed: 11/08/2014 Elsevier Interactive Patient Education  2018 [employer]. Breastfeeding Choosing to breastfeed is one of the best decisions you can make for yourself and your baby. A change in hormones during pregnancy causes your breasts to make breast milk in your milk-producing glands. Hormones prevent breast milk from being released before your baby is born. They also prompt milk flow after birth. Once breastfeeding has begun, thoughts of your baby, as well as his or her sucking or crying, can stimulate the release of milk from your milk-producing glands. Benefits of breastfeeding Research shows that breastfeeding offers many health benefits for infants and mothers. It also offers a cost-free and convenient way to feed your baby. For your baby  Your first milk (colostrum) helps your baby's digestive system to function better.  Special cells in your milk (antibodies) help your baby to fight off infections.  Breastfed babies are less likely to develop asthma, allergies, obesity, or type 2 diabetes. They are also at lower risk for sudden infant death syndrome (SIDS).  Nutrients in  breast milk are better able to meet your baby's needs compared to infant formula.  Breast milk improves your baby's brain development. For you  Breastfeeding helps to create a very special bond between you and your baby.  Breastfeeding is convenient. Breast milk costs nothing and is always available at the correct temperature.  Breastfeeding helps to burn calories. It helps you to lose the weight that you gained during pregnancy.  Breastfeeding makes your uterus return faster to its size before pregnancy. It also slows bleeding (lochia) after you give birth.  Breastfeeding helps to lower your risk of developing type 2 diabetes, osteoporosis, rheumatoid arthritis, cardiovascular disease, and breast, ovarian, uterine, and endometrial cancer later in life. Breastfeeding basics Starting breastfeeding  Find a comfortable place to sit or lie down, with your neck and back well-supported.  Place a pillow or a rolled-up blanket under your baby to bring him or her to the level of your breast (if you are seated). Nursing pillows are specially designed to help support your arms and your baby while you breastfeed.  Make sure that your baby's tummy (abdomen) is facing your abdomen.  Gently massage your breast. With your fingertips, massage from the outer edges of your breast inward toward the nipple. This encourages milk flow. If your milk flows slowly, you may need to continue this action during the feeding.  Support your breast with 4 fingers underneath and your thumb above your nipple (make the letter "C" with your hand). Make sure your fingers are well away from your nipple and your baby's  mouth.  Stroke your baby's lips gently with your finger or nipple.  When your baby's mouth is open wide enough, quickly bring your baby to your breast, placing your entire nipple and as much of the areola as possible into your baby's mouth. The areola is the colored area around your nipple. ? More areola should  be visible above your baby's upper lip than below the lower lip. ? Your baby's lips should be opened and extended outward (flanged) to ensure an adequate, comfortable latch. ? Your baby's tongue should be between his or her lower gum and your breast.  Make sure that your baby's mouth is correctly positioned around your nipple (latched). Your baby's lips should create a seal on your breast and be turned out (everted).  It is common for your baby to suck about 2-3 minutes in order to start the flow of breast milk. Latching Teaching your baby how to latch onto your breast properly is very important. An improper latch can cause nipple pain, decreased milk supply, and poor weight gain in your baby. Also, if your baby is not latched onto your nipple properly, he or she may swallow some air during feeding. This can make your baby fussy. Burping your baby when you switch breasts during the feeding can help to get rid of the air. However, teaching your baby to latch on properly is still the best way to prevent fussiness from swallowing air while breastfeeding. Signs that your baby has successfully latched onto your nipple  Silent tugging or silent sucking, without causing you pain. Infant's lips should be extended outward (flanged).  Swallowing heard between every 3-4 sucks once your milk has started to flow (after your let-down milk reflex occurs).  Muscle movement above and in front of his or her ears while sucking.  Signs that your baby has not successfully latched onto your nipple  Sucking sounds or smacking sounds from your baby while breastfeeding.  Nipple pain.  If you think your baby has not latched on correctly, slip your finger into the corner of your baby's mouth to break the suction and place it between your baby's gums. Attempt to start breastfeeding again. Signs of successful breastfeeding Signs from your baby  Your baby will gradually decrease the number of sucks or will completely  stop sucking.  Your baby will fall asleep.  Your baby's body will relax.  Your baby will retain a small amount of milk in his or her mouth.  Your baby will let go of your breast by himself or herself.  Signs from you  Breasts that have increased in firmness, weight, and size 1-3 hours after feeding.  Breasts that are softer immediately after breastfeeding.  Increased milk volume, as well as a change in milk consistency and color by the fifth day of breastfeeding.  Nipples that are not sore, cracked, or bleeding.  Signs that your baby is getting enough milk  Wetting at least 1-2 diapers during the first 24 hours after birth.  Wetting at least 5-6 diapers every 24 hours for the first week after birth. The urine should be clear or pale yellow by the age of 5 days.  Wetting 6-8 diapers every 24 hours as your baby continues to grow and develop.  At least 3 stools in a 24-hour period by the age of 5 days. The stool should be soft and yellow.  At least 3 stools in a 24-hour period by the age of 7 days. The stool should be seedy and  yellow.  No loss of weight greater than 10% of birth weight during the first 3 days of life.  Average weight gain of 4-7 oz (113-198 g) per week after the age of 4 days.  Consistent daily weight gain by the age of 5 days, without weight loss after the age of 2 weeks. After a feeding, your baby may spit up a small amount of milk. This is normal. Breastfeeding frequency and duration Frequent feeding will help you make more milk and can prevent sore nipples and extremely full breasts (breast engorgement). Breastfeed when you feel the need to reduce the fullness of your breasts or when your baby shows signs of hunger. This is called "breastfeeding on demand." Signs that your baby is hungry include:  Increased alertness, activity, or restlessness.  Movement of the head from side to side.  Opening of the mouth when the corner of the mouth or cheek is  stroked (rooting).  Increased sucking sounds, smacking lips, cooing, sighing, or squeaking.  Hand-to-mouth movements and sucking on fingers or hands.  Fussing or crying.  Avoid introducing a pacifier to your baby in the first 4-6 weeks after your baby is born. After this time, you may choose to use a pacifier. Research has shown that pacifier use during the first year of a baby's life decreases the risk of sudden infant death syndrome (SIDS). Allow your baby to feed on each breast as long as he or she wants. When your baby unlatches or falls asleep while feeding from the first breast, offer the second breast. Because newborns are often sleepy in the first few weeks of life, you may need to awaken your baby to get him or her to feed. Breastfeeding times will vary from baby to baby. However, the following rules can serve as a guide to help you make sure that your baby is properly fed:  Newborns (babies 54 weeks of age or younger) may breastfeed every 1-3 hours.  Newborns should not go without breastfeeding for longer than 3 hours during the day or 5 hours during the night.  You should breastfeed your baby a minimum of 8 times in a 24-hour period.  Breast milk pumping Pumping and storing breast milk allows you to make sure that your baby is exclusively fed your breast milk, even at times when you are unable to breastfeed. This is especially important if you go back to work while you are still breastfeeding, or if you are not able to be present during feedings. Your lactation consultant can help you find a method of pumping that works best for you and give you guidelines about how long it is safe to store breast milk. Caring for your breasts while you breastfeed Nipples can become dry, cracked, and sore while breastfeeding. The following recommendations can help keep your breasts moisturized and healthy:  Avoid using soap on your nipples.  Wear a supportive bra designed especially for nursing.  Avoid wearing underwire-style bras or extremely tight bras (sports bras).  Air-dry your nipples for 3-4 minutes after each feeding.  Use only cotton bra pads to absorb leaked breast milk. Leaking of breast milk between feedings is normal.  Use lanolin on your nipples after breastfeeding. Lanolin helps to maintain your skin's normal moisture barrier. Pure lanolin is not harmful (not toxic) to your baby. You may also hand express a few drops of breast milk and gently massage that milk into your nipples and allow the milk to air-dry.  In the first few weeks  after giving birth, some women experience breast engorgement. Engorgement can make your breasts feel heavy, warm, and tender to the touch. Engorgement peaks within 3-5 days after you give birth. The following recommendations can help to ease engorgement:  Completely empty your breasts while breastfeeding or pumping. You may want to start by applying warm, moist heat (in the shower or with warm, water-soaked hand towels) just before feeding or pumping. This increases circulation and helps the milk flow. If your baby does not completely empty your breasts while breastfeeding, pump any extra milk after he or she is finished.  Apply ice packs to your breasts immediately after breastfeeding or pumping, unless this is too uncomfortable for you. To do this: ? Put ice in a plastic bag. ? Place a towel between your skin and the bag. ? Leave the ice on for 20 minutes, 2-3 times a day.  Make sure that your baby is latched on and positioned properly while breastfeeding.  If engorgement persists after 48 hours of following these recommendations, contact your health care provider or a Science writer. Overall health care recommendations while breastfeeding  Eat 3 healthy meals and 3 snacks every day. Well-nourished mothers who are breastfeeding need an additional 450-500 calories a day. You can meet this requirement by increasing the amount of a  balanced diet that you eat.  Drink enough water to keep your urine pale yellow or clear.  Rest often, relax, and continue to take your prenatal vitamins to prevent fatigue, stress, and low vitamin and mineral levels in your body (nutrient deficiencies).  Do not use any products that contain nicotine or tobacco, such as cigarettes and e-cigarettes. Your baby may be harmed by chemicals from cigarettes that pass into breast milk and exposure to secondhand smoke. If you need help quitting, ask your health care provider.  Avoid alcohol.  Do not use illegal drugs or marijuana.  Talk with your health care provider before taking any medicines. These include over-the-counter and prescription medicines as well as vitamins and herbal supplements. Some medicines that may be harmful to your baby can pass through breast milk.  It is possible to become pregnant while breastfeeding. If birth control is desired, ask your health care provider about options that will be safe while breastfeeding your baby. Where to find more information: [employer]: www.llli.org Contact a health care provider if:  You feel like you want to stop breastfeeding or have become frustrated with breastfeeding.  Your nipples are cracked or bleeding.  Your breasts are red, tender, or warm.  You have: ? Painful breasts or nipples. ? A swollen area on either breast. ? A fever or chills. ? Nausea or vomiting. ? Drainage other than breast milk from your nipples.  Your breasts do not become full before feedings by the fifth day after you give birth.  You feel sad and depressed.  Your baby is: ? Too sleepy to eat well. ? Having trouble sleeping. ? More than 26 week old and wetting fewer than 6 diapers in a 24-hour period. ? Not gaining weight by 56 days of age.  Your baby has fewer than 3 stools in a 24-hour period.  Your baby's skin or the white parts of his or her eyes become yellow. Get help right away  if:  Your baby is overly tired (lethargic) and does not want to wake up and feed.  Your baby develops an unexplained fever. Summary  Breastfeeding offers many health benefits for infant and mothers.  Try to breastfeed your infant when he or she shows early signs of hunger.  Gently tickle or stroke your baby's lips with your finger or nipple to allow the baby to open his or her mouth. Bring the baby to your breast. Make sure that much of the areola is in your baby's mouth. Offer one side and burp the baby before you offer the other side.  Talk with your health care provider or lactation consultant if you have questions or you face problems as you breastfeed. This information is not intended to replace advice given to you by your health care provider. Make sure you discuss any questions you have with your health care provider. Document Released: 10/01/2005 Document Revised: 11/02/2016 Document Reviewed: 11/02/2016 Elsevier Interactive Patient Education  2018 [employer]. Cord Blood Banking Information Cord blood banking is the process of collecting and storing the blood that is in the umbilical cord and placenta at the time of delivery. This blood contains stem cells, which can be used to treat many blood diseases, immune system disorders, and childhood cancers. Stem cells can also be used to research certain diseases and treatments. Many people who choose cord blood banking donate the blood. Donated blood can be used in lifesaving treatments or for research. Other people choose to store the blood privately. Blood that is stored privately can only be used with the person's permission. This option is often chosen if:  A family member needs a stem cell transplant.  The child is part of an ethnic minority.  The child was conceived through in vitro fertilization.  What should I look for in a blood bank? A blood bank is the organization that coordinates cord blood banking. Make sure the cord  blood bank that you use:  Is accredited.  Is financially stable.  Handles a large volume of cord blood samples.  Has a procedure in place for transport and storage.  Allows you the option of transferring your cord blood sample.  Has a procedure in place if the bank goes out of business.  Clearly states all costs and limits to future costs.  People who choose to donate cord blood should not need to pay for blood banking. People who keep the blood for private use will need to pay for the first (initial) storage and pay a fee each year (annual fee). Other fees may also apply. What are the risks of cord blood banking? There are no health risks associated with cord blood banking. It is considered safe. How should I prepare? You must schedule this process at least 4-6 weeks before you will be giving birth. How is the blood collected? The blood is collected as soon as the baby has been delivered. Within 15 minutes of delivery, a health care provider will take these actions to collect the blood:  Clamp the umbilical cord at the top and bottom. This traps the blood in the umbilical cord.  Use a syringe or bag to collect the blood.  Insert needles into the placenta to collect (draw out) more blood.  What happens after the blood is collected? After the blood has been collected:  The blood will be sent to a blood bank.  The blood will be tested for genetic problems and infectious diseases. If the blood tests positive for a genetic problem or a disease, someone will contact you and let you know.  The blood will be frozen.  If your child develops a genetic condition, immune system disorder, or cancer, you will  be responsible for contacting the blood bank and letting them know. This information is not intended to replace advice given to you by your health care provider. Make sure you discuss any questions you have with your health care provider. Document Released: 03/21/2010 Document  Revised: 03/08/2016 Document Reviewed: 03/21/2015 Elsevier Interactive Patient Education  2018 [employer]. Pain Relief During Labor and Delivery Many things can cause pain during labor and delivery, including:  Pressure on bones and ligaments due to the baby moving through the pelvis.  Stretching of tissues due to the baby moving through the birth canal.  Muscle tension due to anxiety or nervousness.  The uterus tightening (contracting) and relaxing to help move the baby.  There are many ways to deal with the pain of labor and delivery. They include:  Taking prenatal classes. Taking these classes helps you know what to expect during your baby's birth. What you learn will increase your confidence and decrease your anxiety.  Practicing relaxation techniques or doing relaxing activities, such as: ? Focused breathing. ? Meditation. ? Visualization. ? Aroma therapy. ? Listening to your favorite music. ? Hypnosis.  Taking a warm shower or bath (hydrotherapy). This may: ? Provide comfort and relaxation. ? Lessen your perception of pain. ? Decrease the amount of pain medicine needed. ? Decrease the length of labor.  Getting a massage or counterpressure on your back.  Applying warm packs or ice packs.  Changing positions often, moving around, or using a birthing ball.  Getting: ? Pain medicine through an IV or injection into a muscle. ? Pain medicine inserted into your spinal column. ? Injections of sterile water just under the skin on your lower back (intradermal injections). ? Laughing gas (nitrous oxide).  Discuss your pain control options with your health care provider during your prenatal visits. Explore the options offered by your hospital or birth center. What kinds of medicine are available? There are two kinds of medicines that can be used to relieve pain during labor and delivery:  Analgesics. These medicines decrease pain without causing you to lose feeling or the  ability to move your muscles.  Anesthetics. These medicines block feeling in the body and can decrease your ability to move freely.  Both of these kinds of medicine can cause minor side effects, such as nausea, trouble concentrating, and sleepiness. They can also decrease the baby's heart rate before birth and affect the baby's breathing rate after birth. For this reason, health care providers are careful about when and how much medicine is given. What are specific medicines and procedures that provide pain relief? Local Anesthetics Local anesthetics are used to numb a small area of the body. They may be used along with another kind of anesthetic or used to numb the nerves of the vagina, cervix, and perineum during the second stage of labor. General Anesthetics General anesthetics cause you to lose consciousness so you do not feel pain. They are usually only used for an emergency cesarean delivery. General anesthetics are given through an IV tube and a mask. Pudendal Block A pudendal block is a form of local anesthetic. It may be used to relieve the pain associated with pushing or stretching of the perineum at the time of delivery or to further numb the perineum. A pudendal block is done by injecting numbing medicine through the vaginal wall into a nerve in the pelvis. Epidural Analgesia Epidural analgesia is given through a flexible IV catheter that is inserted into the lower back. Numbing medicine is  delivered continuously to the area near your spinal column nerves (epidural space). After having this type of analgesia, you may be able to move your legs but you most likely will not be able to walk. Depending on the amount of medicine given, you may lose all feeling in the lower half of your body, or you may retain some level of sensation, including the urge to push. Epidural analgesia can be used to provide pain relief for a vaginal birth. Spinal Block A spinal block is similar to epidural analgesia,  but the medicine is injected into the spinal fluid instead of the epidural space. A spinal block is only given once. It starts to relieve pain quickly, but the pain relief lasts only 1-6 hours. Spinal blocks can be used for cesarean deliveries. Combined Spinal-Epidural (CSE) Block A CSE block combines the effects of a spinal block and epidural analgesia. The spinal block works quickly to block all pain. The epidural analgesia provides continuous pain relief, even after the effects of the spinal block have worn off. This information is not intended to replace advice given to you by your health care provider. Make sure you discuss any questions you have with your health care provider. Document Released: 01/17/2009 Document Revised: 03/09/2016 Document Reviewed: 02/22/2016 Elsevier Interactive Patient Education  2018 [employer]. Common Medications Safe in Pregnancy  Acne:      Constipation:  Benzoyl Peroxide     Colace  Clindamycin      Dulcolax Suppository  Topica Erythromycin     Fibercon  Salicylic Acid      Metamucil         Miralax AVOID:        Senakot   Accutane    Cough:  Retin-A       Cough Drops  Tetracycline      Phenergan w/ Codeine if Rx  Minocycline      Robitussin (Plain & DM)  Antibiotics:     Crabs/Lice:  Ceclor       RID  Cephalosporins    AVOID:  E-Mycins      Kwell  Keflex  Macrobid/Macrodantin   Diarrhea:  Penicillin      Kao-Pectate  Zithromax      Imodium AD         PUSH FLUIDS AVOID:       Cipro     Fever:  Tetracycline      Tylenol (Regular or Extra  Minocycline       Strength)  Levaquin      Extra Strength-Do not          Exceed 8 tabs/24 hrs Caffeine:        <277m/day (equiv. To 1 cup of coffee or  approx. 3 12 oz sodas)         Gas: Cold/Hayfever:       Gas-X  Benadryl      Mylicon  Claritin       Phazyme  **Claritin-D        Chlor-Trimeton    Headaches:  Dimetapp      ASA-Free Excedrin  Drixoral-Non-Drowsy     Cold Compress  Mucinex  (Guaifenasin)     Tylenol (Regular or Extra  Sudafed/Sudafed-12 Hour     Strength)  **Sudafed PE Pseudoephedrine   Tylenol Cold & Sinus     Vicks Vapor Rub  Zyrtec  **AVOID if Problems With Blood Pressure         Heartburn: Avoid lying down for at least 1 hour  after meals  Aciphex      Maalox     Rash:  Milk of Magnesia     Benadryl    Mylanta       1% Hydrocortisone Cream  Pepcid  Pepcid Complete   Sleep Aids:  Prevacid      Ambien   Prilosec       Benadryl  Rolaids       Chamomile Tea  Tums (Limit 4/day)     Unisom  Zantac       Tylenol PM         Warm milk-add vanilla or  Hemorrhoids:       Sugar for taste  Anusol/Anusol H.C.  (RX: Analapram 2.5%)  Sugar Substitutes:  Hydrocortisone OTC     Ok in moderation  Preparation H      Tucks        Vaseline lotion applied to tissue with wiping    Herpes:     Throat:  Acyclovir      Oragel  Famvir  Valtrex     Vaccines:         Flu Shot Leg Cramps:       *Gardasil  Benadryl      Hepatitis A         Hepatitis B Nasal Spray:       Pneumovax  Saline Nasal Spray     Polio Booster         Tetanus Nausea:       Tuberculosis test or PPD  Vitamin B6 25 mg TID   AVOID:    Dramamine      *Gardasil  Emetrol       Live Poliovirus  Ginger Root 250 mg QID    MMR (measles, mumps &  High Complex Carbs @ Bedtime    rebella)  Sea Bands-Accupressure    Varicella (Chickenpox)  Unisom 1/2 tab TID     *No known complications           If received before Pain:         Known pregnancy;   Darvocet       Resume series after  Lortab        Delivery  Percocet    Yeast:   Tramadol      Femstat  Tylenol 3      Gyne-lotrimin  Ultram       Monistat  Vicodin           MISC:         All Sunscreens           Hair Coloring/highlights          Insect Repellant's          (Including DEET)         Mystic Tans Third Trimester of Pregnancy The third trimester is from week 29 through week 42, months 7 through 9. This trimester is when your  unborn baby (fetus) is growing very fast. At the end of the ninth month, the unborn baby is about 20 inches in length. It weighs about 6-10 pounds. Follow these instructions at home:  Avoid all smoking, herbs, and alcohol. Avoid drugs not approved by your doctor.  Do not use any tobacco products, including cigarettes, chewing tobacco, and electronic cigarettes. If you need help quitting, ask your doctor. You may get counseling or other support to help you quit.  Only take medicine as told by your doctor. Some medicines are safe and some are  not during pregnancy.  Exercise only as told by your doctor. Stop exercising if you start having cramps.  Eat regular, healthy meals.  Wear a good support bra if your breasts are tender.  Do not use hot tubs, steam rooms, or saunas.  Wear your seat belt when driving.  Avoid raw meat, uncooked cheese, and liter boxes and soil used by cats.  Take your prenatal vitamins.  Take 1500-2000 milligrams of calcium daily starting at the 20th week of pregnancy until you deliver your baby.  Try taking medicine that helps you poop (stool softener) as needed, and if your doctor approves. Eat more fiber by eating fresh fruit, vegetables, and whole grains. Drink enough fluids to keep your pee (urine) clear or pale yellow.  Take warm water baths (sitz baths) to soothe pain or discomfort caused by hemorrhoids. Use hemorrhoid cream if your doctor approves.  If you have puffy, bulging veins (varicose veins), wear support hose. Raise (elevate) your feet for 15 minutes, 3-4 times a day. Limit salt in your diet.  Avoid heavy lifting, wear low heels, and sit up straight.  Rest with your legs raised if you have leg cramps or low back pain.  Visit your dentist if you have not gone during your pregnancy. Use a soft toothbrush to brush your teeth. Be gentle when you floss.  You can have sex (intercourse) unless your doctor tells you not to.  Do not travel far distances  unless you must. Only do so with your doctor's approval.  Take prenatal classes.  Practice driving to the hospital.  Pack your hospital bag.  Prepare the baby's room.  Go to your doctor visits. Get help if:  You are not sure if you are in labor or if your water has broken.  You are dizzy.  You have mild cramps or pressure in your lower belly (abdominal).  You have a nagging pain in your belly area.  You continue to feel sick to your stomach (nauseous), throw up (vomit), or have watery poop (diarrhea).  You have bad smelling fluid coming from your vagina.  You have pain with peeing (urination). Get help right away if:  You have a fever.  You are leaking fluid from your vagina.  You are spotting or bleeding from your vagina.  You have severe belly cramping or pain.  You lose or gain weight rapidly.  You have trouble catching your breath and have chest pain.  You notice sudden or extreme puffiness (swelling) of your face, hands, ankles, feet, or legs.  You have not felt the baby move in over an hour.  You have severe headaches that do not go away with medicine.  You have vision changes. This information is not intended to replace advice given to you by your health care provider. Make sure you discuss any questions you have with your health care provider. Document Released: 12/26/2009 Document Revised: 03/08/2016 Document Reviewed: 12/02/2012 Elsevier Interactive Patient Education  2017 [employer].

## 2017-12-16 NOTE — Progress Notes (Signed)
ROB- pt is doing well 

## 2017-12-16 NOTE — Progress Notes (Signed)
ROB-Doing well, no questions or concerns. Anticipatory guidance regarding course of prenatal care. TDaP given. Blood transfusion consent reviewed and signed. 28 week labs today, will notify pt via MyChart with results. Handouts: [facility] Development worker, international aid[facility]unteer Doula, [facility], Childbirth class schedule, and PP contraception. Desires epidural. Plans breastfeeding and Mirena PP. Reviewed red flag symptoms and when to call. RTC x 2 week for ROB or sooner if needed.

## 2017-12-17 LAB — GLUCOSE, 1 HOUR GESTATIONAL: Gestational Diabetes Screen: 89 mg/dL (ref 65–139)

## 2017-12-17 LAB — CBC
HEMATOCRIT: 37.4 % (ref 34.0–46.6)
Hemoglobin: 12.5 g/dL (ref 11.1–15.9)
MCH: 31.6 pg (ref 26.6–33.0)
MCHC: 33.4 g/dL (ref 31.5–35.7)
MCV: 95 fL (ref 79–97)
PLATELETS: 189 10*3/uL (ref 150–379)
RBC: 3.95 x10E6/uL (ref 3.77–5.28)
RDW: 12.9 % (ref 12.3–15.4)
WBC: 10.5 10*3/uL (ref 3.4–10.8)

## 2017-12-17 LAB — RPR: RPR: NONREACTIVE

## 2018-01-01 ENCOUNTER — Ambulatory Visit (INDEPENDENT_AMBULATORY_CARE_PROVIDER_SITE_OTHER): Payer: BC Managed Care – PPO | Admitting: Obstetrics and Gynecology

## 2018-01-01 VITALS — BP 125/72 | HR 67 | Wt 196.2 lb

## 2018-01-01 DIAGNOSIS — Z3493 Encounter for supervision of normal pregnancy, unspecified, third trimester: Secondary | ICD-10-CM

## 2018-01-01 LAB — POCT URINALYSIS DIPSTICK
BILIRUBIN UA: NEGATIVE
GLUCOSE UA: NEGATIVE
KETONES UA: NEGATIVE
Nitrite, UA: NEGATIVE
Protein, UA: NEGATIVE
RBC UA: NEGATIVE
SPEC GRAV UA: 1.015 (ref 1.010–1.025)
UROBILINOGEN UA: 0.2 U/dL
pH, UA: 7 (ref 5.0–8.0)

## 2018-01-01 NOTE — Progress Notes (Signed)
ROB- pt is doing well 

## 2018-01-01 NOTE — Progress Notes (Signed)
ROB- doing well, no cocerns

## 2018-01-16 ENCOUNTER — Ambulatory Visit (INDEPENDENT_AMBULATORY_CARE_PROVIDER_SITE_OTHER): Payer: BC Managed Care – PPO | Admitting: Certified Nurse Midwife

## 2018-01-16 VITALS — BP 103/69 | HR 74 | Wt 198.4 lb

## 2018-01-16 DIAGNOSIS — Z3493 Encounter for supervision of normal pregnancy, unspecified, third trimester: Secondary | ICD-10-CM | POA: Diagnosis not present

## 2018-01-16 LAB — POCT URINALYSIS DIPSTICK
Bilirubin, UA: NEGATIVE
Blood, UA: NEGATIVE
Glucose, UA: NEGATIVE
KETONES UA: NEGATIVE
Leukocytes, UA: NEGATIVE
NITRITE UA: NEGATIVE
PH UA: 5 (ref 5.0–8.0)
PROTEIN UA: NEGATIVE
SPEC GRAV UA: 1.02 (ref 1.010–1.025)
UROBILINOGEN UA: 0.2 U/dL

## 2018-01-16 NOTE — Progress Notes (Signed)
Pt is here for an ROB visit. 

## 2018-01-16 NOTE — Patient Instructions (Signed)
Check out [email or web address]  Fetal Movement Counts Patient Name: ________________________________________________ Patient Due Date: ____________________ What is a fetal movement count? A fetal movement count is the number of times that you feel your baby move during a certain amount of time. This may also be called a fetal kick count. A fetal movement count is recommended for every pregnant woman. You may be asked to start counting fetal movements as early as week 28 of your pregnancy. Pay attention to when your baby is most active. You may notice your baby's sleep and wake cycles. You may also notice things that make your baby move more. You should do a fetal movement count:  When your baby is normally most active.  At the same time each day.  A good time to count movements is while you are resting, after having something to eat and drink. How do I count fetal movements? 1. Find a quiet, comfortable area. Sit, or lie down on your side. 2. Write down the date, the start time and stop time, and the number of movements that you felt between those two times. Take this information with you to your health care visits. 3. For 2 hours, count kicks, flutters, swishes, rolls, and jabs. You should feel at least 10 movements during 2 hours. 4. You may stop counting after you have felt 10 movements. 5. If you do not feel 10 movements in 2 hours, have something to eat and drink. Then, keep resting and counting for 1 hour. If you feel at least 4 movements during that hour, you may stop counting. Contact a health care provider if:  You feel fewer than 4 movements in 2 hours.  Your baby is not moving like he or she usually does. Date: ____________ Start time: ____________ Stop time: ____________ Movements: ____________ Date: ____________ Start time: ____________ Stop time: ____________ Movements: ____________ Date: ____________ Start time: ____________ Stop time: ____________ Movements:  ____________ Date: ____________ Start time: ____________ Stop time: ____________ Movements: ____________ Date: ____________ Start time: ____________ Stop time: ____________ Movements: ____________ Date: ____________ Start time: ____________ Stop time: ____________ Movements: ____________ Date: ____________ Start time: ____________ Stop time: ____________ Movements: ____________ Date: ____________ Start time: ____________ Stop time: ____________ Movements: ____________ Date: ____________ Start time: ____________ Stop time: ____________ Movements: ____________ This information is not intended to replace advice given to you by your health care provider. Make sure you discuss any questions you have with your health care provider. Document Released: 10/31/2006 Document Revised: 05/30/2016 Document Reviewed: 11/10/2015 Elsevier Interactive Patient Education  [employer]2018 [employer]

## 2018-01-16 NOTE — Progress Notes (Signed)
ROB-Doing well, no questions or concerns. Discussed "spinning babies" for optimization of fetal positioning. Anticipatory guidance regarding course of prenatal care. Reviewed red flag symptoms and when to call. RTC x 3 weeks for 36 week cultures and ROB or sooner if needed.

## 2018-02-07 ENCOUNTER — Telehealth: Payer: Self-pay

## 2018-02-07 ENCOUNTER — Encounter: Payer: Self-pay | Admitting: Certified Nurse Midwife

## 2018-02-07 ENCOUNTER — Telehealth: Payer: Self-pay | Admitting: Certified Nurse Midwife

## 2018-02-07 ENCOUNTER — Ambulatory Visit (INDEPENDENT_AMBULATORY_CARE_PROVIDER_SITE_OTHER): Payer: BC Managed Care – PPO | Admitting: Certified Nurse Midwife

## 2018-02-07 VITALS — BP 110/68 | HR 79 | Temp 97.8°F | Wt 199.4 lb

## 2018-02-07 DIAGNOSIS — Z3A36 36 weeks gestation of pregnancy: Secondary | ICD-10-CM

## 2018-02-07 LAB — POCT URINALYSIS DIPSTICK
BILIRUBIN UA: NEGATIVE
Glucose, UA: NEGATIVE
Ketones, UA: NEGATIVE
LEUKOCYTES UA: NEGATIVE
Nitrite, UA: NEGATIVE
Protein, UA: NEGATIVE
RBC UA: NEGATIVE
Spec Grav, UA: 1.01 (ref 1.010–1.025)
Urobilinogen, UA: 0.2 E.U./dL
pH, UA: 6.5 (ref 5.0–8.0)

## 2018-02-07 NOTE — Addendum Note (Signed)
Addended by: Mechele ClaudeHOMPSON, Eldredge Veldhuizen M on: 02/07/2018 03:25 PM   Modules accepted: Orders

## 2018-02-07 NOTE — Progress Notes (Signed)
ROB, pt states she does not feel well. She is concerned because after her 1st baby she had an appendicitis. She did not have her appendix removed because she was postpartum but was treated with IV antibiotics. She states that she had nausea this morning and had abdominal pain after vomiting. She is afebrile today. Her abdomen is non tender to palpation. She has not had any pain abdominal pain since this morning. Explained that the abdominal pain is more than likely from the retching with vomiting. Red flag symptoms reviewed. Pt encouraged to go ED with fever, if she has recurrent N&V, or abdominal pain. She verbalizes and agrees to plan. GBS collected today. Pt declines SVE. Leopolds- vertex. Follow up 1 wk or sooner as needed.   Doreene BurkeAnnie Viktorya Arguijo, CNM

## 2018-02-07 NOTE — Addendum Note (Signed)
Addended by: Brooke DareSICK, Datron Brakebill L on: 02/07/2018 03:37 PM   Modules accepted: Orders

## 2018-02-07 NOTE — Patient Instructions (Signed)
Group B Streptococcus Infection During Pregnancy Group B Streptococcus (GBS) is a type of bacteria (Streptococcus agalactiae) that is often found in healthy people, commonly in the rectum, vagina, and intestines. In people who are healthy and not pregnant, the bacteria rarely cause serious illness or complications. However, women who test positive for GBS during pregnancy can pass the bacteria to their baby during childbirth, which can cause serious infection in the baby after birth. Women with GBS may also have infections during their pregnancy or immediately after childbirth, such as such as urinary tract infections (UTIs) or infections of the uterus (uterine infections). Having GBS also increases a woman's risk of complications during pregnancy, such as early (preterm) labor or delivery, miscarriage, or stillbirth. Routine testing (screening) for GBS is recommended for all pregnant women. What increases the risk? You may have a higher risk for GBS infection during pregnancy if you had one during a past pregnancy. What are the signs or symptoms? In most cases, GBS infection does not cause symptoms in pregnant women. Signs and symptoms of a possible GBS-related infection may include:  Labor starting before the 37th week of pregnancy.  A UTI or bladder infection, which may cause: ? Fever. ? Pain or burning during urination. ? Frequent urination.  Fever during labor, along with: ? Bad-smelling discharge. ? Uterine tenderness. ? Rapid heartbeat in the mother, baby, or both.  Rare but serious symptoms of a possible GBS-related infection in women include:  Blood infection (septicemia). This may cause fever, chills, or confusion.  Lung infection (pneumonia). This may cause fever, chills, cough, rapid breathing, difficulty breathing, or chest pain.  Bone, joint, skin, or soft tissue infection.  How is this diagnosed? You may be screened for GBS between week 35 and week 37 of your pregnancy. If  you have symptoms of preterm labor, you may be screened earlier. This condition is diagnosed based on lab test results from:  A swab of fluid from the vagina and rectum.  A urine sample.  How is this treated? This condition is treated with antibiotic medicine. When you go into labor, or as soon as your water breaks (your membranes rupture), you will be given antibiotics through an IV tube. Antibiotics will continue until after you give birth. If you are having a cesarean delivery, you do not need antibiotics unless your membranes have already ruptured. Follow these instructions at home:  Take over-the-counter and prescription medicines only as told by your health care provider.  Take your antibiotic medicine as told by your health care provider. Do not stop taking the antibiotic even if you start to feel better.  Keep all pre-birth (prenatal) visits and follow-up visits as told by your health care provider. This is important. Contact a health care provider if:  You have pain or burning when you urinate.  You have to urinate frequently.  You have a fever or chills.  You develop a bad-smelling vaginal discharge. Get help right away if:  Your membranes rupture.  You go into labor.  You have severe pain in your abdomen.  You have difficulty breathing.  You have chest pain. This information is not intended to replace advice given to you by your health care provider. Make sure you discuss any questions you have with your health care provider. Document Released: 01/08/2008 Document Revised: 04/27/2016 Document Reviewed: 04/26/2016 Elsevier Interactive Patient Education  2018 [employer] Inc.  

## 2018-02-07 NOTE — Telephone Encounter (Signed)
Returned pts call- reminded she has an appointment today at 2:45. She states she was aware of the appointment. Encouraged to keep that appointment to be assessed.

## 2018-02-07 NOTE — Progress Notes (Signed)
Pt is here for an ROB visit. Vomited x1 this AM then had severe right side pain. Has a history of appendicitis.

## 2018-02-07 NOTE — Telephone Encounter (Signed)
The patient called and stated that she is experiencing severe cramping on the right side of her abdomin. The patient would like to have a nurse call her back. Please advise.

## 2018-02-09 LAB — STREP GP B NAA: Strep Gp B NAA: NEGATIVE

## 2018-02-10 ENCOUNTER — Encounter (INDEPENDENT_AMBULATORY_CARE_PROVIDER_SITE_OTHER): Payer: Self-pay

## 2018-02-13 LAB — GC/CHLAMYDIA PROBE AMP
Chlamydia trachomatis, NAA: NEGATIVE
NEISSERIA GONORRHOEAE BY PCR: NEGATIVE

## 2018-02-14 ENCOUNTER — Ambulatory Visit (INDEPENDENT_AMBULATORY_CARE_PROVIDER_SITE_OTHER): Payer: BC Managed Care – PPO | Admitting: Certified Nurse Midwife

## 2018-02-14 VITALS — BP 112/65 | HR 69 | Wt 203.4 lb

## 2018-02-14 DIAGNOSIS — Z3493 Encounter for supervision of normal pregnancy, unspecified, third trimester: Secondary | ICD-10-CM | POA: Diagnosis not present

## 2018-02-14 LAB — POCT URINALYSIS DIPSTICK
Bilirubin, UA: NEGATIVE
Glucose, UA: NEGATIVE
Ketones, UA: NEGATIVE
LEUKOCYTES UA: NEGATIVE
Nitrite, UA: NEGATIVE
PROTEIN UA: NEGATIVE
RBC UA: NEGATIVE
Urobilinogen, UA: 0.2 E.U./dL
pH, UA: 7.5 (ref 5.0–8.0)

## 2018-02-14 NOTE — Progress Notes (Signed)
Pt is here for an ROB visit. 

## 2018-02-14 NOTE — Patient Instructions (Signed)
Vaginal Delivery Vaginal delivery means that you will give birth by pushing your baby out of your birth canal (vagina). A team of health care providers will help you before, during, and after vaginal delivery. Birth experiences are unique for every woman and every pregnancy, and birth experiences vary depending on where you choose to give birth. What should I do to prepare for my baby's birth? Before your baby is born, it is important to talk with your health care provider about:  Your labor and delivery preferences. These may include: ? Medicines that you may be given. ? How you will manage your pain. This might include non-medical pain relief techniques or injectable pain relief such as epidural analgesia. ? How you and your baby will be monitored during labor and delivery. ? Who may be in the labor and delivery room with you. ? Your feelings about surgical delivery of your baby (cesarean delivery, or C-section) if this becomes necessary. ? Your feelings about receiving donated blood through an IV tube (blood transfusion) if this becomes necessary.  Whether you are able: ? To take pictures or videos of the birth. ? To eat during labor and delivery. ? To move around, walk, or change positions during labor and delivery.  What to expect after your baby is born, such as: ? Whether delayed umbilical cord clamping and cutting is offered. ? Who will care for your baby right after birth. ? Medicines or tests that may be recommended for your baby. ? Whether breastfeeding is supported in your hospital or birth center. ? How long you will be in the hospital or birth center.  How any medical conditions you have may affect your baby or your labor and delivery experience.  To prepare for your baby's birth, you should also:  Attend all of your health care visits before delivery (prenatal visits) as recommended by your health care provider. This is important.  Prepare your home for your baby's  arrival. Make sure that you have: ? Diapers. ? Baby clothing. ? Feeding equipment. ? Safe sleeping arrangements for you and your baby.  Install a car seat in your vehicle. Have your car seat checked by a certified car seat installer to make sure that it is installed safely.  Think about who will help you with your new baby at home for at least the first several weeks after delivery.  What can I expect when I arrive at the birth center or hospital? Once you are in labor and have been admitted into the hospital or birth center, your health care provider may:  Review your pregnancy history and any concerns you have.  Insert an IV tube into one of your veins. This is used to give you fluids and medicines.  Check your blood pressure, pulse, temperature, and heart rate (vital signs).  Check whether your bag of water (amniotic sac) has broken (ruptured).  Talk with you about your birth plan and discuss pain control options.  Monitoring Your health care provider may monitor your contractions (uterine monitoring) and your baby's heart rate (fetal monitoring). You may need to be monitored:  Often, but not continuously (intermittently).  All the time or for long periods at a time (continuously). Continuous monitoring may be needed if: ? You are taking certain medicines, such as medicine to relieve pain or make your contractions stronger. ? You have pregnancy or labor complications.  Monitoring may be done by:  Placing a special stethoscope or a handheld monitoring device on your abdomen to   check your baby's heartbeat, and feeling your abdomen for contractions. This method of monitoring does not continuously record your baby's heartbeat or your contractions.  Placing monitors on your abdomen (external monitors) to record your baby's heartbeat and the frequency and length of contractions. You may not have to wear external monitors all the time.  Placing monitors inside of your uterus  (internal monitors) to record your baby's heartbeat and the frequency, length, and strength of your contractions. ? Your health care provider may use internal monitors if he or she needs more information about the strength of your contractions or your baby's heart rate. ? Internal monitors are put in place by passing a thin, flexible wire through your vagina and into your uterus. Depending on the type of monitor, it may remain in your uterus or on your baby's head until birth. ? Your health care provider will discuss the benefits and risks of internal monitoring with you and will ask for your permission before inserting the monitors.  Telemetry. This is a type of continuous monitoring that can be done with external or internal monitors. Instead of having to stay in bed, you are able to move around during telemetry. Ask your health care provider if telemetry is an option for you.  Physical exam Your health care provider may perform a physical exam. This may include:  Checking whether your baby is positioned: ? With the head toward your vagina (head-down). This is most common. ? With the head toward the top of your uterus (head-up or breech). If your baby is in a breech position, your health care provider may try to turn your baby to a head-down position so you can deliver vaginally. If it does not seem that your baby can be born vaginally, your provider may recommend surgery to deliver your baby. In rare cases, you may be able to deliver vaginally if your baby is head-up (breech delivery). ? Lying sideways (transverse). Babies that are lying sideways cannot be delivered vaginally.  Checking your cervix to determine: ? Whether it is thinning out (effacing). ? Whether it is opening up (dilating). ? How low your baby has moved into your birth canal.  What are the three stages of labor and delivery?  Normal labor and delivery is divided into the following three stages: Stage 1  Stage 1 is the  longest stage of labor, and it can last for hours or days. Stage 1 includes: ? Early labor. This is when contractions may be irregular, or regular and mild. Generally, early labor contractions are more than 10 minutes apart. ? Active labor. This is when contractions get longer, more regular, more frequent, and more intense. ? The transition phase. This is when contractions happen very close together, are very intense, and may last longer than during any other part of labor.  Contractions generally feel mild, infrequent, and irregular at first. They get stronger, more frequent (about every 2-3 minutes), and more regular as you progress from early labor through active labor and transition.  Many women progress through stage 1 naturally, but you may need help to continue making progress. If this happens, your health care provider may talk with you about: ? Rupturing your amniotic sac if it has not ruptured yet. ? Giving you medicine to help make your contractions stronger and more frequent.  Stage 1 ends when your cervix is completely dilated to 4 inches (10 cm) and completely effaced. This happens at the end of the transition phase. Stage 2  Once   your cervix is completely effaced and dilated to 4 inches (10 cm), you may start to feel an urge to push. It is common for the body to naturally take a rest before feeling the urge to push, especially if you received an epidural or certain other pain medicines. This rest period may last for up to 1-2 hours, depending on your unique labor experience.  During stage 2, contractions are generally less painful, because pushing helps relieve contraction pain. Instead of contraction pain, you may feel stretching and burning pain, especially when the widest part of your baby's head passes through the vaginal opening (crowning).  Your health care provider will closely monitor your pushing progress and your baby's progress through the vagina during stage 2.  Your  health care provider may massage the area of skin between your vaginal opening and anus (perineum) or apply warm compresses to your perineum. This helps it stretch as the baby's head starts to crown, which can help prevent perineal tearing. ? In some cases, an incision may be made in your perineum (episiotomy) to allow the baby to pass through the vaginal opening. An episiotomy helps to make the opening of the vagina larger to allow more room for the baby to fit through.  It is very important to breathe and focus so your health care provider can control the delivery of your baby's head. Your health care provider may have you decrease the intensity of your pushing, to help prevent perineal tearing.  After delivery of your baby's head, the shoulders and the rest of the body generally deliver very quickly and without difficulty.  Once your baby is delivered, the umbilical cord may be cut right away, or this may be delayed for 1-2 minutes, depending on your baby's health. This may vary among health care providers, hospitals, and birth centers.  If you and your baby are healthy enough, your baby may be placed on your chest or abdomen to help maintain the baby's temperature and to help you bond with each other. Some mothers and babies start breastfeeding at this time. Your health care team will dry your baby and help keep your baby warm during this time.  Your baby may need immediate care if he or she: ? Showed signs of distress during labor. ? Has a medical condition. ? Was born too early (prematurely). ? Had a bowel movement before birth (meconium). ? Shows signs of difficulty transitioning from being inside the uterus to being outside of the uterus. If you are planning to breastfeed, your health care team will help you begin a feeding. Stage 3  The third stage of labor starts immediately after the birth of your baby and ends after you deliver the placenta. The placenta is an organ that develops  during pregnancy to provide oxygen and nutrients to your baby in the womb.  Delivering the placenta may require some pushing, and you may have mild contractions. Breastfeeding can stimulate contractions to help you deliver the placenta.  After the placenta is delivered, your uterus should tighten (contract) and become firm. This helps to stop bleeding in your uterus. To help your uterus contract and to control bleeding, your health care provider may: ? Give you medicine by injection, through an IV tube, by mouth, or through your rectum (rectally). ? Massage your abdomen or perform a vaginal exam to remove any blood clots that are left in your uterus. ? Empty your bladder by placing a thin, flexible tube (catheter) into your bladder. ? Encourage   you to breastfeed your baby. After labor is over, you and your baby will be monitored closely to ensure that you are both healthy until you are ready to go home. Your health care team will teach you how to care for yourself and your baby. This information is not intended to replace advice given to you by your health care provider. Make sure you discuss any questions you have with your health care provider. Document Released: 07/10/2008 Document Revised: 04/20/2016 Document Reviewed: 10/16/2015 Elsevier Interactive Patient Education  2018 [employer]  

## 2018-02-17 NOTE — Progress Notes (Signed)
ROB-Doing well, no questions or concerns. Discussed GBS negative status. Reviewed red flag symptoms and when to call. RTC x 1 week for ROB or sooner if needed.

## 2018-02-18 ENCOUNTER — Other Ambulatory Visit: Payer: Self-pay

## 2018-02-18 MED ORDER — PANTOPRAZOLE SODIUM 40 MG PO TBEC: 40.0000 mg | DELAYED_RELEASE_TABLET | Freq: Every day | ORAL | 3 refills | Status: DC

## 2018-02-20 ENCOUNTER — Ambulatory Visit (INDEPENDENT_AMBULATORY_CARE_PROVIDER_SITE_OTHER): Payer: BC Managed Care – PPO | Admitting: Certified Nurse Midwife

## 2018-02-20 VITALS — BP 112/70 | HR 70 | Wt 204.3 lb

## 2018-02-20 DIAGNOSIS — Z3493 Encounter for supervision of normal pregnancy, unspecified, third trimester: Secondary | ICD-10-CM

## 2018-02-20 LAB — POCT URINALYSIS DIPSTICK
BILIRUBIN UA: NEGATIVE
GLUCOSE UA: NEGATIVE
Ketones, UA: NEGATIVE
LEUKOCYTES UA: NEGATIVE
Nitrite, UA: NEGATIVE
Protein, UA: NEGATIVE
RBC UA: NEGATIVE
SPEC GRAV UA: 1.01 (ref 1.010–1.025)
Urobilinogen, UA: 0.2 E.U./dL
pH, UA: 7 (ref 5.0–8.0)

## 2018-02-20 NOTE — Progress Notes (Signed)
ROB-Doing well, desires breast pump through insurance. Breast pump Rx completed and faxed. Reviewed red flag symptoms and when to call. RTC x 1 week for ROB or sooner if needed.

## 2018-02-20 NOTE — Patient Instructions (Signed)

## 2018-02-20 NOTE — Progress Notes (Signed)
Pt is here for an ROB visit. 

## 2018-02-26 ENCOUNTER — Ambulatory Visit (INDEPENDENT_AMBULATORY_CARE_PROVIDER_SITE_OTHER): Payer: BC Managed Care – PPO | Admitting: Obstetrics and Gynecology

## 2018-02-26 VITALS — BP 123/84 | HR 86 | Wt 205.6 lb

## 2018-02-26 DIAGNOSIS — Z3493 Encounter for supervision of normal pregnancy, unspecified, third trimester: Secondary | ICD-10-CM

## 2018-02-26 LAB — POCT URINALYSIS DIPSTICK
Bilirubin, UA: NEGATIVE
Blood, UA: NEGATIVE
Ketones, UA: NEGATIVE
LEUKOCYTES UA: NEGATIVE
NITRITE UA: NEGATIVE
SPEC GRAV UA: 1.01 (ref 1.010–1.025)
Urobilinogen, UA: 0.2 E.U./dL
pH, UA: 6 (ref 5.0–8.0)

## 2018-02-26 NOTE — Progress Notes (Signed)
ROB- pt is having some pelvic pressure 

## 2018-02-26 NOTE — Progress Notes (Signed)
ROB-doing well, declined pelvic, discussed postdates care.

## 2018-03-04 ENCOUNTER — Ambulatory Visit (INDEPENDENT_AMBULATORY_CARE_PROVIDER_SITE_OTHER): Payer: BC Managed Care – PPO | Admitting: Certified Nurse Midwife

## 2018-03-04 VITALS — BP 112/71 | HR 77 | Wt 204.2 lb

## 2018-03-04 DIAGNOSIS — Z3493 Encounter for supervision of normal pregnancy, unspecified, third trimester: Secondary | ICD-10-CM

## 2018-03-04 LAB — POCT URINALYSIS DIPSTICK
Bilirubin, UA: NEGATIVE
Blood, UA: NEGATIVE
Glucose, UA: NEGATIVE
KETONES UA: NEGATIVE
LEUKOCYTES UA: NEGATIVE
NITRITE UA: NEGATIVE
PH UA: 6 (ref 5.0–8.0)
Protein, UA: NEGATIVE
Spec Grav, UA: 1.01 (ref 1.010–1.025)
UROBILINOGEN UA: 0.2 U/dL

## 2018-03-04 NOTE — Progress Notes (Signed)
Pt is here for an ROB visit. Would like to be checked. 

## 2018-03-04 NOTE — Patient Instructions (Signed)
Nonstress Test The nonstress test is a procedure that monitors the fetus's heartbeat. The test will monitor the heartbeat when the fetus is at rest and while the fetus is moving. In a healthy fetus, there will be an increase in fetal heart rate when the fetus moves or kicks. The heart rate will decrease at rest. This test helps determine if the fetus is healthy. Your health care provider will look at a number of patterns in the heart rate tracing to make sure your baby is thriving. If there is concern, your health care provider may order additional tests or may suggest another course of action. This test is often done in the third trimester and can help determine if an early delivery is needed and safe. Common reasons to have this test are:  You are past your due date.  You have a high-risk pregnancy.  You are feeling less movement than normal.  You have lost a pregnancy in the past.  Your health care provider suspects fetal growth problems.  You have too much or too little amniotic fluid.  What happens before the procedure?  Eat a meal right before the test or as directed by your health care provider. Food may help stimulate fetal movements.  Use the restroom right before the test. What happens during the procedure?  Two belts will be placed around your abdomen. These belts have monitors attached to them. One records the fetal heart rate and the other records uterine contractions.  You may be asked to lie down on your side or to stay sitting upright.  You may be given a button to press when you feel movement.  The fetal heartbeat is listened to and watched on a screen. The heartbeat is recorded on a sheet of paper.  If the fetus seems to be sleeping, you may be asked to drink some juice or soda, gently press your abdomen, or make some noise to wake the fetus. What happens after the procedure? Your health care provider will discuss the test results with you and make recommendations  for the near future.  This information is not intended to replace advice given to you by your health care provider. Make sure you discuss any questions you have with your health care provider. This information is not intended to replace advice given to you by your health care provider. Make sure you discuss any questions you have with your health care provider. Document Released: 09/21/2002 Document Revised: 08/31/2016 Document Reviewed: 11/04/2012 Elsevier Interactive Patient Education  2018 [employer]  

## 2018-03-04 NOTE — Progress Notes (Signed)
ROB-Doing well. Changed her mind and declines SVE. Discussed postdates care. Reviewed red flag symptoms and when to call. RTC x Friday for NST and ROB. RTC x Tuesday for BPP and ROB.

## 2018-03-05 ENCOUNTER — Other Ambulatory Visit: Payer: Self-pay | Admitting: Certified Nurse Midwife

## 2018-03-05 DIAGNOSIS — O480 Post-term pregnancy: Secondary | ICD-10-CM

## 2018-03-07 ENCOUNTER — Other Ambulatory Visit: Payer: BC Managed Care – PPO

## 2018-03-07 ENCOUNTER — Encounter: Payer: Self-pay | Admitting: Certified Nurse Midwife

## 2018-03-07 ENCOUNTER — Ambulatory Visit (INDEPENDENT_AMBULATORY_CARE_PROVIDER_SITE_OTHER): Payer: BC Managed Care – PPO | Admitting: Certified Nurse Midwife

## 2018-03-07 VITALS — BP 111/67 | HR 84 | Wt 202.9 lb

## 2018-03-07 DIAGNOSIS — O480 Post-term pregnancy: Secondary | ICD-10-CM

## 2018-03-07 DIAGNOSIS — Z3493 Encounter for supervision of normal pregnancy, unspecified, third trimester: Secondary | ICD-10-CM

## 2018-03-07 LAB — POCT URINALYSIS DIPSTICK
Bilirubin, UA: NEGATIVE
Glucose, UA: NEGATIVE
KETONES UA: NEGATIVE
NITRITE UA: NEGATIVE
ODOR: NEGATIVE
PH UA: 7.5 (ref 5.0–8.0)
PROTEIN UA: NEGATIVE
RBC UA: NEGATIVE
Spec Grav, UA: 1.01 (ref 1.010–1.025)
UROBILINOGEN UA: 0.2 U/dL

## 2018-03-07 MED ORDER — GUAIFENESIN-CODEINE 100-10 MG/5ML PO SOLN: 5.0000 mL | Freq: Three times a day (TID) | ORAL | 0 refills | Status: DC | PRN

## 2018-03-07 NOTE — Progress Notes (Signed)
ROB , doing well. Feels good movement. NST today for post dates. Reactive. Baseline 120. Moderate variability, Accelerations present. No decelerations. Contractions- none. SVE per pt request 1/thick/-3. Pt complains of cough that is keeping her up at night. Prescription for robitussin with codeine  At night to help her rest. Return on Tuesday for BPP and NST. Will schedule induction.   Doreene Burke, CNM

## 2018-03-07 NOTE — Progress Notes (Signed)
ROB and nst-   NONSTRESS TEST INTERPRETATION  INDICATIONS: Post dates  FHR baseline:  RESULTS:Reactive COMMENTS:   PLAN: 1. Continue fetal kick counts twice a day. 2. Continue antepartum testing as scheduled-Biweekly 3.  Darol Destine, CMA

## 2018-03-07 NOTE — Patient Instructions (Signed)
Braxton Hicks Contractions °Contractions of the uterus can occur throughout pregnancy, but they are not always a sign that you are in labor. You may have practice contractions called Braxton Hicks contractions. These false labor contractions are sometimes confused with true labor. °What are Braxton Hicks contractions? °Braxton Hicks contractions are tightening movements that occur in the muscles of the uterus before labor. Unlike true labor contractions, these contractions do not result in opening (dilation) and thinning of the cervix. Toward the end of pregnancy (32-34 weeks), Braxton Hicks contractions can happen more often and may become stronger. These contractions are sometimes difficult to tell apart from true labor because they can be very uncomfortable. You should not feel embarrassed if you go to the hospital with false labor. °Sometimes, the only way to tell if you are in true labor is for your health care provider to look for changes in the cervix. The health care provider will do a physical exam and may monitor your contractions. If you are not in true labor, the exam should show that your cervix is not dilating and your water has not broken. °If there are other health problems associated with your pregnancy, it is completely safe for you to be sent home with false labor. You may continue to have Braxton Hicks contractions until you go into true labor. °How to tell the difference between true labor and false labor °True labor °· Contractions last 30-70 seconds. °· Contractions become very regular. °· Discomfort is usually felt in the top of the uterus, and it spreads to the lower abdomen and low back. °· Contractions do not go away with walking. °· Contractions usually become more intense and increase in frequency. °· The cervix dilates and gets thinner. °False labor °· Contractions are usually shorter and not as strong as true labor contractions. °· Contractions are usually irregular. °· Contractions  are often felt in the front of the lower abdomen and in the groin. °· Contractions may go away when you walk around or change positions while lying down. °· Contractions get weaker and are shorter-lasting as time goes on. °· The cervix usually does not dilate or become thin. °Follow these instructions at home: °· Take over-the-counter and prescription medicines only as told by your health care provider. °· Keep up with your usual exercises and follow other instructions from your health care provider. °· Eat and drink lightly if you think you are going into labor. °· If Braxton Hicks contractions are making you uncomfortable: °? Change your position from lying down or resting to walking, or change from walking to resting. °? Sit and rest in a tub of warm water. °? Drink enough fluid to keep your urine pale yellow. Dehydration may cause these contractions. °? Do slow and deep breathing several times an hour. °· Keep all follow-up prenatal visits as told by your health care provider. This is important. °Contact a health care provider if: °· You have a fever. °· You have continuous pain in your abdomen. °Get help right away if: °· Your contractions become stronger, more regular, and closer together. °· You have fluid leaking or gushing from your vagina. °· You pass blood-tinged mucus (bloody show). °· You have bleeding from your vagina. °· You have low back pain that you never had before. °· You feel your baby’s head pushing down and causing pelvic pressure. °· Your baby is not moving inside you as much as it used to. °Summary °· Contractions that occur before labor are called Braxton   Hicks contractions, false labor, or practice contractions. °· Braxton Hicks contractions are usually shorter, weaker, farther apart, and less regular than true labor contractions. True labor contractions usually become progressively stronger and regular and they become more frequent. °· Manage discomfort from Braxton Hicks contractions by  changing position, resting in a warm bath, drinking plenty of water, or practicing deep breathing. °This information is not intended to replace advice given to you by your health care provider. Make sure you discuss any questions you have with your health care provider. °Document Released: 02/14/2017 Document Revised: 02/14/2017 Document Reviewed: 02/14/2017 °Elsevier Interactive Patient Education © 2018 [employer] Inc[employer] ° °

## 2018-03-10 ENCOUNTER — Other Ambulatory Visit: Payer: Self-pay | Admitting: Certified Nurse Midwife

## 2018-03-11 ENCOUNTER — Other Ambulatory Visit: Payer: Self-pay

## 2018-03-11 ENCOUNTER — Encounter: Payer: BC Managed Care – PPO | Admitting: Certified Nurse Midwife

## 2018-03-11 ENCOUNTER — Other Ambulatory Visit: Payer: BC Managed Care – PPO

## 2018-03-11 ENCOUNTER — Inpatient Hospital Stay: Payer: BC Managed Care – PPO | Admitting: Anesthesiology

## 2018-03-11 ENCOUNTER — Inpatient Hospital Stay
Admission: EM | Admit: 2018-03-11 | Discharge: 2018-03-13 | DRG: 807 | Disposition: A | Discharge status: Home / Self Care | Payer: BC Managed Care – PPO | Source: Ambulatory Visit | Attending: Certified Nurse Midwife | Admitting: Certified Nurse Midwife

## 2018-03-11 DIAGNOSIS — Z3A40 40 weeks gestation of pregnancy: Secondary | ICD-10-CM

## 2018-03-11 DIAGNOSIS — O480 Post-term pregnancy: Principal | ICD-10-CM | POA: Diagnosis present

## 2018-03-11 LAB — CBC
HEMATOCRIT: 33.3 % — AB (ref 35.0–47.0)
HEMOGLOBIN: 11.8 g/dL — AB (ref 12.0–16.0)
MCH: 32.4 pg (ref 26.0–34.0)
MCHC: 35.4 g/dL (ref 32.0–36.0)
MCV: 91.6 fL (ref 80.0–100.0)
Platelets: 212 10*3/uL (ref 150–440)
RBC: 3.63 MIL/uL — ABNORMAL LOW (ref 3.80–5.20)
RDW: 14.8 % — ABNORMAL HIGH (ref 11.5–14.5)
WBC: 10 10*3/uL (ref 3.6–11.0)

## 2018-03-11 LAB — TYPE AND SCREEN
ABO/RH(D): O POS
ANTIBODY SCREEN: NEGATIVE

## 2018-03-11 MED ORDER — LACTATED RINGERS IV SOLN: 500.0000 mL | INTRAVENOUS | Status: DC | PRN

## 2018-03-11 MED ORDER — EPHEDRINE 5 MG/ML INJ
10.0000 mg | INTRAVENOUS | Status: DC | PRN
  Filled 2018-03-11: qty 2

## 2018-03-11 MED ORDER — MISOPROSTOL 25 MCG QUARTER TABLET
50.0000 ug | ORAL_TABLET | ORAL | Status: DC
  Administered 2018-03-11: 50 ug via VAGINAL
  Filled 2018-03-11: qty 1

## 2018-03-11 MED ORDER — PHENYLEPHRINE 40 MCG/ML (10ML) SYRINGE FOR IV PUSH (FOR BLOOD PRESSURE SUPPORT)
80.0000 ug | PREFILLED_SYRINGE | INTRAVENOUS | Status: DC | PRN
  Filled 2018-03-11: qty 5

## 2018-03-11 MED ORDER — BUTORPHANOL TARTRATE 1 MG/ML IJ SOLN
1.0000 mg | INTRAMUSCULAR | Status: DC | PRN
Start: 2018-03-11 — End: 2018-03-12

## 2018-03-11 MED ORDER — TERBUTALINE SULFATE 1 MG/ML IJ SOLN
INTRAMUSCULAR | Status: AC
  Administered 2018-03-11: 0.25 mg via SUBCUTANEOUS
  Filled 2018-03-11: qty 1

## 2018-03-11 MED ORDER — SOD CITRATE-CITRIC ACID 500-334 MG/5ML PO SOLN
ORAL | Status: AC
  Filled 2018-03-11: qty 15

## 2018-03-11 MED ORDER — BUPIVACAINE HCL (PF) 0.25 % IJ SOLN
INTRAMUSCULAR | Status: DC | PRN
  Administered 2018-03-11: 5 mL via EPIDURAL

## 2018-03-11 MED ORDER — MISOPROSTOL 200 MCG PO TABS
ORAL_TABLET | ORAL | Status: AC
  Administered 2018-03-11: 50 ug via VAGINAL
  Filled 2018-03-11: qty 4

## 2018-03-11 MED ORDER — OXYTOCIN 40 UNITS IN LACTATED RINGERS INFUSION - SIMPLE MED
1.0000 m[IU]/min | INTRAVENOUS | Status: DC
  Administered 2018-03-11: 2 m[IU]/min via INTRAVENOUS

## 2018-03-11 MED ORDER — OXYTOCIN 40 UNITS IN LACTATED RINGERS INFUSION - SIMPLE MED
2.5000 [IU]/h | INTRAVENOUS | Status: DC
  Administered 2018-03-11: 2.5 [IU]/h via INTRAVENOUS

## 2018-03-11 MED ORDER — SOD CITRATE-CITRIC ACID 500-334 MG/5ML PO SOLN: 30.0000 mL | ORAL | Status: DC | PRN

## 2018-03-11 MED ORDER — OXYTOCIN 10 UNIT/ML IJ SOLN: 10.0000 [IU] | Freq: Once | INTRAMUSCULAR | Status: DC

## 2018-03-11 MED ORDER — AMMONIA AROMATIC IN INHA
RESPIRATORY_TRACT | Status: AC
  Filled 2018-03-11: qty 10

## 2018-03-11 MED ORDER — LACTATED RINGERS IV SOLN
INTRAVENOUS | Status: DC
  Administered 2018-03-11 (×2): via INTRAVENOUS

## 2018-03-11 MED ORDER — OXYTOCIN 40 UNITS IN LACTATED RINGERS INFUSION - SIMPLE MED
INTRAVENOUS | Status: AC
  Filled 2018-03-11: qty 1000

## 2018-03-11 MED ORDER — LACTATED RINGERS IV SOLN
INTRAVENOUS | Status: DC
  Administered 2018-03-11: 400 mL via INTRAVENOUS

## 2018-03-11 MED ORDER — IBUPROFEN 600 MG PO TABS
ORAL_TABLET | ORAL | Status: AC
  Filled 2018-03-11: qty 1

## 2018-03-11 MED ORDER — LIDOCAINE HCL (PF) 1 % IJ SOLN: 30.0000 mL | INTRAMUSCULAR | Status: DC | PRN

## 2018-03-11 MED ORDER — OXYTOCIN BOLUS FROM INFUSION
500.0000 mL | Freq: Once | INTRAVENOUS | Status: AC
  Administered 2018-03-11: 500 mL via INTRAVENOUS

## 2018-03-11 MED ORDER — LIDOCAINE HCL (PF) 1 % IJ SOLN
INTRAMUSCULAR | Status: AC
  Filled 2018-03-11: qty 30

## 2018-03-11 MED ORDER — DIPHENHYDRAMINE HCL 50 MG/ML IJ SOLN: 12.5000 mg | INTRAMUSCULAR | Status: DC | PRN

## 2018-03-11 MED ORDER — ACETAMINOPHEN 325 MG PO TABS
650.0000 mg | ORAL_TABLET | ORAL | Status: DC | PRN
  Filled 2018-03-11: qty 2

## 2018-03-11 MED ORDER — FENTANYL 2.5 MCG/ML W/ROPIVACAINE 0.15% IN NS 100 ML EPIDURAL (ARMC)
12.0000 mL/h | EPIDURAL | Status: DC
  Administered 2018-03-11: 12 mL/h via EPIDURAL

## 2018-03-11 MED ORDER — IBUPROFEN 600 MG PO TABS
600.0000 mg | ORAL_TABLET | Freq: Four times a day (QID) | ORAL | Status: DC
  Administered 2018-03-11 – 2018-03-13 (×7): 600 mg via ORAL
  Filled 2018-03-11 (×6): qty 1

## 2018-03-11 MED ORDER — LACTATED RINGERS IV SOLN: 500.0000 mL | Freq: Once | INTRAVENOUS | Status: DC

## 2018-03-11 MED ORDER — ONDANSETRON HCL 4 MG/2ML IJ SOLN: 4.0000 mg | Freq: Four times a day (QID) | INTRAMUSCULAR | Status: DC | PRN

## 2018-03-11 MED ORDER — TERBUTALINE SULFATE 1 MG/ML IJ SOLN: 0.2500 mg | Freq: Once | INTRAMUSCULAR | Status: DC | PRN

## 2018-03-11 MED ORDER — OXYTOCIN 10 UNIT/ML IJ SOLN
INTRAMUSCULAR | Status: AC
  Filled 2018-03-11: qty 2

## 2018-03-11 MED ORDER — TERBUTALINE SULFATE 1 MG/ML IJ SOLN
0.2500 mg | Freq: Once | INTRAMUSCULAR | Status: AC | PRN
  Administered 2018-03-11: 0.25 mg via SUBCUTANEOUS

## 2018-03-11 MED ORDER — FENTANYL 2.5 MCG/ML W/ROPIVACAINE 0.15% IN NS 100 ML EPIDURAL (ARMC)
EPIDURAL | Status: AC
  Filled 2018-03-11: qty 100

## 2018-03-11 MED ORDER — STERILE WATER FOR INJECTION IJ SOLN
INTRAMUSCULAR | Status: AC
  Filled 2018-03-11: qty 50

## 2018-03-11 NOTE — Progress Notes (Signed)
RN notified provider of patient having recurrent late decelerations with nursing interventions. Will continue to monitor.

## 2018-03-11 NOTE — Progress Notes (Signed)
LABOR NOTE   Margrit Minner 32 y.o.GP@ at [redacted]w[redacted]d Not in labor.  SUBJECTIVE:  Feeling some cramping with contractions OBJECTIVE:  BP 117/72   Pulse 80   Temp (!) 97.4 F (36.3 C) (Oral)   Resp 18   Ht  (1.727 m)   Wt 202 lb (91.6 kg)   LMP 05/29/2017   BMI 30.71 kg/m  No intake/output data recorded.  She has shown cervical change. CERVIX: 3 cm:  50%:   -3:   posterior:   firm SVE:   Dilation: 3 Effacement (%): 50 Station: -3 Exam by:: Doreene Burke CNM CONTRACTIONS:irregular, every 3-5 minutes FHR: Fetal heart tracing reviewed. Baseline: 130 bpm, Variability: Good {> 6 bpm), Accelerations: Reactive and Decelerations: Absent Category I  Analgesia: Labor support without medications  Labs: Lab Results  Component Value Date   WBC 10.0 03/11/2018   HGB 11.8 (L) 03/11/2018   HCT 33.3 (L) 03/11/2018   MCV 91.6 03/11/2018   PLT 212 03/11/2018    ASSESSMENT: 1) Labor curve reviewed.       Progress: Not in labor.     Membranes: intact       Active Problems:   Labor and delivery, indication for care   PLAN: IV Pitocin augmentation  Doreene Burke, CNM  03/11/2018 12:49 PM

## 2018-03-11 NOTE — Progress Notes (Signed)
LABOR NOTE   Joanna Lynch 32 y.o.GP@ at [redacted]w[redacted]d Early latent labor.  SUBJECTIVE:  Feeling cramps OBJECTIVE:  BP 122/64   Pulse 72   Temp 97.9 F (36.6 C) (Oral)   Resp 18   Ht  (1.727 m)   Wt 202 lb (91.6 kg)   LMP 05/29/2017   BMI 30.71 kg/m  No intake/output data recorded.  She has shown cervical change. CERVIX: 4cm:  70%:   -2   posterior:   soft SVE:   Dilation: 4 Effacement (%): 70 Station: -2 Exam by:: Doreene Burke CNM CONTRACTIONS: regular, every 2-3 minutes FHR: Fetal heart tracing reviewed. Baseline: 135 bpm, Variability: Good {> 6 bpm), Accelerations: Reactive and Decelerations: Absent Category I Analgesia: Labor support without medications  Labs: Lab Results  Component Value Date   WBC 10.0 03/11/2018   HGB 11.8 (L) 03/11/2018   HCT 33.3 (L) 03/11/2018   MCV 91.6 03/11/2018   PLT 212 03/11/2018    ASSESSMENT: 1) Labor curve reviewed.       Progress: Early latent labor.     Membranes: ruptured, clear fluid      Active Problems:   Labor and delivery, indication for care Post dates  PLAN: continue present management  Doreene Burke, CNM  03/11/2018 2:03 PM

## 2018-03-11 NOTE — Progress Notes (Signed)
Provider re-notified of patient having recurrent late decelerations. Provider verbalized RN to give patient Terbutaline. RN gave patient a shot of terbutaline and will continue to monitor.

## 2018-03-11 NOTE — H&P (Signed)
History and Physical   HPI  Joanna Lynch is a 32 y.o. G2P1001 at [redacted]w[redacted]d Estimated Date of Delivery: 03/05/18 who is being admitted for induction of labor due to post dates.   OB History  OB History  Gravida Para Term Preterm AB Living  0 0 1  SAB TAB Ectopic Multiple Live Births  0 0 0 0 1    # Outcome Date GA Lbr Len/2nd Weight Sex Delivery Anes PTL Lv  2 Current           1 Term 09/17/16 [redacted]w[redacted]d / 01:56 8 lb 2.2 oz (3.69 kg) F Vag-Spont EPI  LIV     Name: Eden,GIRL Zenda     Apgar1: 8  Apgar5: 9    PROBLEM LIST  Pregnancy complications or risks: Patient Active Problem List   Diagnosis Date Noted  . Labor and delivery, indication for care 03/11/2018    Prenatal labs and studies: ABO, Rh: --/--/O POS (05/28 0602) Antibody: NEG (05/28 0602) Rubella: 1.70 (10/26 1502) RPR: Non Reactive (03/04 0925)  HBsAg: Negative (10/26 1502)  HIV: Non Reactive (10/26 1502)  ZOX:WRUEAVWU (04/26 1518)   Past Medical History:  Diagnosis Date  . Appendicitis 10/2016   no surgery, was given medication     Past Surgical History:  Procedure Laterality Date  . NASAL SEPTUM SURGERY  2005     Medications    Current Discharge Medication List    CONTINUE these medications which have NOT CHANGED   Details  guaiFENesin-codeine 100-10 MG/5ML syrup Take 5 mLs by mouth 3 (three) times daily as needed for cough. Qty: 120 mL, Refills: 0    pantoprazole (PROTONIX) 40 MG tablet Take 1 tablet (40 mg total) by mouth daily. Qty: 30 tablet, Refills: 3    Prenatal MV-Min-Fe Fum-FA-DHA (PRENATAL 1 PO) Take by mouth daily.         Allergies  Patient has no known allergies.  Review of Systems  Constitutional: negative Eyes: negative Ears, nose, mouth, throat, and face: negative Respiratory: negative Cardiovascular: negative Gastrointestinal: negative Genitourinary:negative Integument/breast: negative Hematologic/lymphatic:  negative Musculoskeletal:negative Neurological: negative Behavioral/Psych: negative Endocrine: negative Allergic/Immunologic: negative    Physical Exam  BP 117/72   Pulse 80   Temp (!) 97.4 F (36.3 C) (Oral)   Resp 18   Ht  (1.727 m)   Wt 202 lb (91.6 kg)   LMP 05/29/2017   BMI 30.71 kg/m   Lungs:  Clear bilaterally  Cardio: RRR  Abd: Soft, gravid, NT Presentation: cephalic EXT: No C/C/ 1+ Edema CERVIX: not evaluated  :See Prenatal records for more detailed PE.     FHR:  Baseline: 130 bpm, Variability: Good {> 6 bpm), Accelerations: Reactive and Decelerations: Absent  Toco: Uterine Contractions: irritability    Test Results  Results for orders placed or performed during the hospital encounter of 03/11/18 (from the past 24 hour(s))  Type and screen     Status: None   Collection Time: 03/11/18  6:02 AM  Result Value Ref Range   ABO/RH(D) O POS    Antibody Screen NEG    Sample Expiration      03/14/2018 Performed at [facility], [address] [place], [place] [address]   CBC     Status: Abnormal   Collection Time: 03/11/18  6:03 AM  Result Value Ref Range   WBC 10.0 3.6 - 11.0 K/uL   RBC 3.63 (L) 3.80 - 5.20 MIL/uL   Hemoglobin 11.8 (L) 12.0 -  16.0 g/dL   HCT 16.1 (L) 09.6 - 04.5 %   MCV 91.6 80.0 - 100.0 fL   MCH 32.4 26.0 - 34.0 pg   MCHC 35.4 32.0 - 36.0 g/dL   RDW 40.9 (H) 81.1 - 91.4 %   Platelets 212 150 - 440 K/uL   Group B Strep negative  Assessment   G2P1001 at [redacted]w[redacted]d Estimated Date of Delivery: 03/05/18  The fetus is reassuring.   Patient Active Problem List   Diagnosis Date Noted  . Labor and delivery, indication for care 03/11/2018    Plan  1. Admit to L&D :   [facility] 2. EFM:-- Category 1 3. Epidural if desired. Stadol for IV pain until epidural requested. 4. Admission labs completed 5. Anticipate NSVD  Doreene Burke, CNM 03/11/2018 8:00 AM

## 2018-03-11 NOTE — Anesthesia Procedure Notes (Signed)
Epidural Patient location during procedure: OB  Staffing Anesthesiologist: Jeanean Hollett, MD Performed: anesthesiologist   Preanesthetic Checklist Completed: patient identified, site marked, surgical consent, pre-op evaluation, timeout performed, IV checked, risks and benefits discussed and monitors and equipment checked  Epidural Patient position: sitting Prep: ChloraPrep Patient monitoring: heart rate, continuous pulse ox and blood pressure Approach: midline Location: L4-L5 Injection technique: LOR saline  Needle:  Needle type: Tuohy  Needle gauge: 18 G Needle length: 9 cm and 9 Catheter type: closed end flexible Catheter size: 20 Guage Test dose: negative and 1.5% lidocaine with Epi 1:200 K  Assessment Sensory level: T10 Events: blood not aspirated, injection not painful, no injection resistance, negative IV test and no paresthesia  Additional Notes   Patient tolerated the insertion well without complications.Reason for block:procedure for pain     

## 2018-03-11 NOTE — Progress Notes (Signed)
LABOR NOTE   Joanna Lynch 32 y.o.GP@ at 109w6d Not evaluated. RN called stating that patient having reoperative decelerations. Nursing interventions completed( maternal position change, IV fluid bolus, oxygen) with no improvement of decelerations.   SUBJECTIVE:  Feeling cramps  OBJECTIVE:  BP 117/72   Pulse 80   Temp (!) 97.4 F (36.3 C) (Oral)   Resp 18   Ht  (1.727 m)   Wt 202 lb (91.6 kg)   LMP 05/29/2017   BMI 30.71 kg/m  No intake/output data recorded.  She has not shown cervical change. CERVIX: not evaluated:   SVE: per RN exam no change  CONTRACTIONS: regular, every 3-4 minutes FHR: Fetal heart tracing reviewed. Baseline: 150 bpm, Variability: Good {> 6 bpm), Accelerations: absent and Decelerations: Late Category II Analgesia: Labor support without medications  Labs: Lab Results  Component Value Date   WBC 10.0 03/11/2018   HGB 11.8 (L) 03/11/2018   HCT 33.3 (L) 03/11/2018   MCV 91.6 03/11/2018   PLT 212 03/11/2018    ASSESSMENT: 1) Labor curve reviewed.       Progress: Not in labor.     Membranes: intact      2)  Non reassuring fetal heart rate   Active Problems:   Labor and delivery, indication for care Post Dates  PLAN: Dose of terbutaline given, fetal heart rate decelerations stopped   Doreene Burke, CNM  03/11/2018 12:53 PM

## 2018-03-11 NOTE — Anesthesia Preprocedure Evaluation (Signed)
Anesthesia Evaluation  Patient identified by MRN, date of birth, ID band Patient awake    Reviewed: Allergy & Precautions, NPO status , Patient's Chart, lab work & pertinent test results, reviewed documented beta blocker date and time   Airway Mallampati: II  TM Distance: >3 FB     Dental  (+) Chipped   Pulmonary           Cardiovascular      Neuro/Psych    GI/Hepatic   Endo/Other    Renal/GU      Musculoskeletal   Abdominal   Peds  Hematology   Anesthesia Other Findings   Reproductive/Obstetrics                             Anesthesia Physical Anesthesia Plan  ASA: II  Anesthesia Plan:    Post-op Pain Management:    Induction:   PONV Risk Score and Plan:   Airway Management Planned:   Additional Equipment:   Intra-op Plan:   Post-operative Plan:   Informed Consent: I have reviewed the patients History and Physical, chart, labs and discussed the procedure including the risks, benefits and alternatives for the proposed anesthesia with the patient or authorized representative who has indicated his/her understanding and acceptance.     Plan Discussed with: CRNA  Anesthesia Plan Comments:         Anesthesia Quick Evaluation

## 2018-03-11 NOTE — Progress Notes (Signed)
LABOR NOTE   Joanna Lynch 32 y.o.GP@ at [redacted]w[redacted]d Active phase labor.  SUBJECTIVE:  Comfortable with epidural  OBJECTIVE:  BP (!) 115/54   Pulse 68   Temp 98.3 F (36.8 C) (Oral)   Resp 18   Ht  (1.727 m)   Wt 202 lb (91.6 kg)   LMP 05/29/2017   SpO2 100%   BMI 30.71 kg/m  No intake/output data recorded.  She has shown cervical change. CERVIX: 7cm:   SVE:   Dilation: 7 Effacement (%): 80 Station: -1 Exam by:: [place] c RN CONTRACTIONS: irregular, every 4 minutes FHR: Fetal heart tracing reviewed. Baseline: 130 bpm, Variability: Good {> 6 bpm), Accelerations: Reactive and Decelerations: early, variable, and lates present Category II   Analgesia: Epidural  Labs: Lab Results  Component Value Date   WBC 10.0 03/11/2018   HGB 11.8 (L) 03/11/2018   HCT 33.3 (L) 03/11/2018   MCV 91.6 03/11/2018   PLT 212 03/11/2018    ASSESSMENT: 1) Labor curve reviewed.       Progress: Active phase labor.     Membranes: ruptured, clear fluid, IUPC placed        Active Problems:   Labor and delivery, indication for care Post dates  PLAN: continue present management, restart pitocin at half ( 6 mu).  Discussed plan of care with pt and her family. They verbalize and agree with plan of care.   Doreene Burke, CNM  03/11/2018 6:39 PM

## 2018-03-12 LAB — CBC
HCT: 31.8 % — ABNORMAL LOW (ref 35.0–47.0)
Hemoglobin: 11.5 g/dL — ABNORMAL LOW (ref 12.0–16.0)
MCH: 33 pg (ref 26.0–34.0)
MCHC: 36.1 g/dL — ABNORMAL HIGH (ref 32.0–36.0)
MCV: 91.5 fL (ref 80.0–100.0)
Platelets: 191 10*3/uL (ref 150–440)
RBC: 3.48 MIL/uL — AB (ref 3.80–5.20)
RDW: 14.9 % — ABNORMAL HIGH (ref 11.5–14.5)
WBC: 11.3 10*3/uL — AB (ref 3.6–11.0)

## 2018-03-12 LAB — RPR: RPR Ser Ql: NONREACTIVE

## 2018-03-12 MED ORDER — SIMETHICONE 80 MG PO CHEW: 80.0000 mg | CHEWABLE_TABLET | ORAL | Status: DC | PRN

## 2018-03-12 MED ORDER — ACETAMINOPHEN 325 MG PO TABS
650.0000 mg | ORAL_TABLET | ORAL | Status: DC | PRN
  Administered 2018-03-12 (×2): 650 mg via ORAL
  Filled 2018-03-12 (×2): qty 2

## 2018-03-12 MED ORDER — METHYLERGONOVINE MALEATE 0.2 MG/ML IJ SOLN: 0.2000 mg | INTRAMUSCULAR | Status: DC | PRN

## 2018-03-12 MED ORDER — TETANUS-DIPHTH-ACELL PERTUSSIS 5-2.5-18.5 LF-MCG/0.5 IM SUSP: 0.5000 mL | Freq: Once | INTRAMUSCULAR | Status: DC

## 2018-03-12 MED ORDER — OXYCODONE-ACETAMINOPHEN 5-325 MG PO TABS: 2.0000 | ORAL_TABLET | ORAL | Status: DC | PRN

## 2018-03-12 MED ORDER — DIBUCAINE 1 % RE OINT: 1.0000 "application " | TOPICAL_OINTMENT | RECTAL | Status: DC | PRN

## 2018-03-12 MED ORDER — ONDANSETRON HCL 4 MG/2ML IJ SOLN: 4.0000 mg | INTRAMUSCULAR | Status: DC | PRN

## 2018-03-12 MED ORDER — COCONUT OIL OIL
1.0000 "application " | TOPICAL_OIL | Status: DC | PRN
  Administered 2018-03-12: 1 via TOPICAL
  Filled 2018-03-12: qty 120

## 2018-03-12 MED ORDER — FERROUS SULFATE 325 (65 FE) MG PO TABS
325.0000 mg | ORAL_TABLET | Freq: Every day | ORAL | Status: DC
  Administered 2018-03-12 – 2018-03-13 (×2): 325 mg via ORAL
  Filled 2018-03-12 (×2): qty 1

## 2018-03-12 MED ORDER — METHYLERGONOVINE MALEATE 0.2 MG PO TABS: 0.2000 mg | ORAL_TABLET | ORAL | Status: DC | PRN

## 2018-03-12 MED ORDER — DIPHENHYDRAMINE HCL 25 MG PO CAPS: 25.0000 mg | ORAL_CAPSULE | Freq: Four times a day (QID) | ORAL | Status: DC | PRN

## 2018-03-12 MED ORDER — WITCH HAZEL-GLYCERIN EX PADS: 1.0000 "application " | MEDICATED_PAD | CUTANEOUS | Status: DC | PRN

## 2018-03-12 MED ORDER — DOCUSATE SODIUM 100 MG PO CAPS
100.0000 mg | ORAL_CAPSULE | Freq: Two times a day (BID) | ORAL | Status: DC
  Administered 2018-03-12 – 2018-03-13 (×3): 100 mg via ORAL
  Filled 2018-03-12 (×3): qty 1

## 2018-03-12 MED ORDER — BENZOCAINE-MENTHOL 20-0.5 % EX AERO
1.0000 "application " | INHALATION_SPRAY | CUTANEOUS | Status: DC | PRN
  Administered 2018-03-12: 1 via TOPICAL

## 2018-03-12 MED ORDER — SENNOSIDES-DOCUSATE SODIUM 8.6-50 MG PO TABS
2.0000 | ORAL_TABLET | ORAL | Status: DC
  Administered 2018-03-13: 2 via ORAL
  Filled 2018-03-12: qty 2

## 2018-03-12 MED ORDER — ONDANSETRON HCL 4 MG PO TABS
4.0000 mg | ORAL_TABLET | ORAL | Status: DC | PRN
Start: 2018-03-12 — End: 2018-03-13

## 2018-03-12 MED ORDER — PRENATAL MULTIVITAMIN CH
1.0000 | ORAL_TABLET | Freq: Every day | ORAL | Status: DC
  Administered 2018-03-12 – 2018-03-13 (×2): 1 via ORAL
  Filled 2018-03-12 (×2): qty 1

## 2018-03-12 MED ORDER — OXYCODONE-ACETAMINOPHEN 5-325 MG PO TABS: 1.0000 | ORAL_TABLET | ORAL | Status: DC | PRN

## 2018-03-12 NOTE — Plan of Care (Addendum)
Patient's vital signs stable; fundus firm; small amount rubra lochia; good appetite; good po fluids; voiding without difficulty; breastfeeding well with good technique observed; husband at bedside and attentive; pain controlled with po motrin and po tylenol; patient demo's peri care with peri bottle, demaplast spray, and ice packs. Patient and her husband watched Period of Purple Crying DVD as plans for discharge tomorrow.

## 2018-03-12 NOTE — Progress Notes (Signed)
Patient ID: Joanna Lynch, female   DOB: [birth year], 32 y.o.   MRN: [id number]  Post Partum Day # 1, s/p SVD  Subjective:  Doing well, no questions or concerns. FOB at bedside. Infant in nursery.   Denies difficulty breathing or respiratory distress, chest pain, abdominal pain, excessive vaginal bleeding, dysuria, and leg pain or swelling.   Objective:  Temp:  [97.5 F (36.4 C)-98.3 F (36.8 C)] 98.1 F (36.7 C) (05/29 0804) Pulse Rate:  [59-85] 71 (05/29 0804) Resp:  [18-20] 20 (05/29 0804) BP: (104-135)/(54-89) 108/72 (05/29 0804) SpO2:  [98 %-100 %] 98 % (05/29 0804)  Physical Exam:   General: alert and cooperative   Lungs: clear to auscultation bilaterally  Breasts: deferred, no complaints  Heart: regular rate and rhythm, S1, S2 normal, no murmur, click, rub or gallop  Abdomen: soft, non-tender; bowel sounds normal; no masses,  no organomegaly  Pelvis: Lochia: appropriate,  Uterine Fundus: firm  Extremities: DVT Evaluation: No evidence of DVT seen on physical exam. Negative Homan's sign.  Recent Labs    03/11/18 0603 03/12/18 0652  HGB 11.8* 11.5*  HCT 33.3* 31.8*    Assessment/Plan: Plan for discharge tomorrow, Breastfeeding and Lactation consult   LOS: 1 day   Gunnar Bulla, CNM [facility], [facility] 03/12/2018 9:18 AM

## 2018-03-12 NOTE — Anesthesia Postprocedure Evaluation (Signed)
Anesthesia Post Note  Patient: Joanna Lynch  Procedure(s) Performed: AN AD HOC LABOR EPIDURAL  Patient location during evaluation: Mother Baby Anesthesia Type: Epidural Level of consciousness: awake, awake and alert and oriented Pain management: pain level controlled Vital Signs Assessment: post-procedure vital signs reviewed and stable Respiratory status: spontaneous breathing, nonlabored ventilation and respiratory function stable Cardiovascular status: stable Anesthetic complications: no     Last Vitals:  Vitals:   03/12/18 0453 03/12/18 0453  BP: 110/63 110/63  Pulse: (!) 59 (!) 59  Resp: 18 20  Temp: (!) 36.4 C (!) 36.4 C  SpO2: 99% 99%    Last Pain:  Vitals:   03/12/18 0453  TempSrc: Oral  PainSc:                  Casey Burkitt

## 2018-03-13 MED ORDER — IBUPROFEN 600 MG PO TABS: 600.0000 mg | ORAL_TABLET | Freq: Four times a day (QID) | ORAL | 0 refills | Status: DC

## 2018-03-13 MED ORDER — FERROUS SULFATE 325 (65 FE) MG PO TABS: 325.0000 mg | ORAL_TABLET | Freq: Every day | ORAL | 3 refills | Status: DC

## 2018-03-13 NOTE — Discharge Instructions (Signed)
Vaginal Delivery, Care After °Refer to this sheet in the next few weeks. These instructions provide you with information about caring for yourself after vaginal delivery. Your health care provider may also give you more specific instructions. Your treatment has been planned according to current medical practices, but problems sometimes occur. Call your health care provider if you have any problems or questions. °What can I expect after the procedure? °After vaginal delivery, it is common to have: °· Some bleeding from your vagina. °· Soreness in your abdomen, your vagina, and the area of skin between your vaginal opening and your anus (perineum). °· Pelvic cramps. °· Fatigue. ° °Follow these instructions at home: °Medicines °· Take over-the-counter and prescription medicines only as told by your health care provider. °· If you were prescribed an antibiotic medicine, take it as told by your health care provider. Do not stop taking the antibiotic until it is finished. °Driving ° °· Do not drive or operate heavy machinery while taking prescription pain medicine. °· Do not drive for 24 hours if you received a sedative. °Lifestyle °· Do not drink alcohol. This is especially important if you are breastfeeding or taking medicine to relieve pain. °· Do not use tobacco products, including cigarettes, chewing tobacco, or e-cigarettes. If you need help quitting, ask your health care provider. °Eating and drinking °· Drink at least 8 eight-ounce glasses of water every day unless you are told not to by your health care provider. If you choose to breastfeed your baby, you may need to drink more water than this. °· Eat high-fiber foods every day. These foods may help prevent or relieve constipation. High-fiber foods include: °? Whole grain cereals and breads. °? Brown rice. °? Beans. °? Fresh fruits and vegetables. °Activity °· Return to your normal activities as told by your health care provider. Ask your health care provider  what activities are safe for you. °· Rest as much as possible. Try to rest or take a nap when your baby is sleeping. °· Do not lift anything that is heavier than your baby or 10 lb (4.5 kg) until your health care provider says that it is safe. °· Talk with your health care provider about when you can engage in sexual activity. This may depend on your: °? Risk of infection. °? Rate of healing. °? Comfort and desire to engage in sexual activity. °Vaginal Care °· If you have an episiotomy or a vaginal tear, check the area every day for signs of infection. Check for: °? More redness, swelling, or pain. °? More fluid or blood. °? Warmth. °? Pus or a bad smell. °· Do not use tampons or douches until your health care provider says this is safe. °· Watch for any blood clots that may pass from your vagina. These may look like clumps of dark red, brown, or black discharge. °General instructions °· Keep your perineum clean and dry as told by your health care provider. °· Wear loose, comfortable clothing. °· Wipe from front to back when you use the toilet. °· Ask your health care provider if you can shower or take a bath. If you had an episiotomy or a perineal tear during labor and delivery, your health care provider may tell you not to take baths for a certain length of time. °· Wear a bra that supports your breasts and fits you well. °· If possible, have someone help you with household activities and help care for your baby for at least a few days after   you leave the hospital.  Keep all follow-up visits for you and your baby as told by your health care provider. This is important. Contact a health care provider if:  You have: ? Vaginal discharge that has a bad smell. ? Difficulty urinating. ? Pain when urinating. ? A sudden increase or decrease in the frequency of your bowel movements. ? More redness, swelling, or pain around your episiotomy or vaginal tear. ? More fluid or blood coming from your episiotomy or  vaginal tear. ? Pus or a bad smell coming from your episiotomy or vaginal tear. ? A fever. ? A rash. ? Little or no interest in activities you used to enjoy. ? Questions about caring for yourself or your baby.  Your episiotomy or vaginal tear feels warm to the touch.  Your episiotomy or vaginal tear is separating or does not appear to be healing.  Your breasts are painful, hard, or turn red.  You feel unusually sad or worried.  You feel nauseous or you vomit.  You pass large blood clots from your vagina. If you pass a blood clot from your vagina, save it to show to your health care provider. Do not flush blood clots down the toilet without having your health care provider look at them.  You urinate more than usual.  You are dizzy or light-headed.  You have not breastfed at all and you have not had a menstrual period for 12 weeks after delivery.  You have stopped breastfeeding and you have not had a menstrual period for 12 weeks after you stopped breastfeeding. Get help right away if:  You have: ? Pain that does not go away or does not get better with medicine. ? Chest pain. ? Difficulty breathing. ? Blurred vision or spots in your vision. ? Thoughts about hurting yourself or your baby.  You develop pain in your abdomen or in one of your legs.  You develop a severe headache.  You faint.  You bleed from your vagina so much that you fill two sanitary pads in one hour. This information is not intended to replace advice given to you by your health care provider. Make sure you discuss any questions you have with your health care provider. Document Released: 09/28/2000 Document Revised: 03/14/2016 Document Reviewed: 10/16/2015 Elsevier Interactive Patient Education  2018 [employer]. How to Take a [employer] A sitz bath is a warm water bath that is taken while you are sitting down. The water should only come up to your hips and should cover your buttocks. Your health care  provider may recommend a sitz bath to help you:  Clean the lower part of your body, including your genital area.  With itching.  With pain.  With sore muscles or muscles that tighten or spasm.  How to take a sitz bath Take 3-4 sitz baths per day or as told by your health care provider. 1. Partially fill a bathtub with warm water. You will only need the water to be deep enough to cover your hips and buttocks when you are sitting in it. 2. If your health care provider told you to put medicine in the water, follow the directions exactly. 3. Sit in the water and open the tub drain a little. 4. Turn on the warm water again to keep the tub at the correct level. Keep the water running constantly. 5. Soak in the water for 15-20 minutes or as told by your health care provider. 6. After the sitz bath, pat the  affected area dry first. Do not rub it. 7. Be careful when you stand up after the sitz bath because you may feel dizzy.  Contact a health care provider if:  Your symptoms get worse. Do not continue with sitz baths if your symptoms get worse.  You have new symptoms. Do not continue with sitz baths until you talk with your health care provider. This information is not intended to replace advice given to you by your health care provider. Make sure you discuss any questions you have with your health care provider. Document Released: 06/23/2004 Document Revised: 02/29/2016 Document Reviewed: 09/29/2014 Elsevier Interactive Patient Education  2018 [employer]. Home Care Instructions for Mom ACTIVITY  Gradually return to your regular activities.  Let yourself rest. Nap while your baby sleeps.  Avoid lifting anything that is heavier than 10 lb (4.5 kg) until your health care provider says it is okay.  Avoid activities that take a lot of effort and energy (are strenuous) until approved by your health care provider. Walking at a slow-to-moderate pace is usually safe.  If you had a cesarean  delivery: ? Do not vacuum, climb stairs, or drive a car for 4-6 weeks. ? Have someone help you at home until you feel like you can do your usual activities yourself. ? Do exercises as told by your health care provider, if this applies.  VAGINAL BLEEDING You may continue to bleed for 4-6 weeks after delivery. Over time, the amount of blood usually decreases and the color of the blood usually gets lighter. However, the flow of bright red blood may increase if you have been too active. If you need to use more than one pad in an hour because your pad gets soaked, or if you pass a large clot:  Lie down.  Raise your feet.  Place a cold compress on your lower abdomen.  Rest.  Call your health care provider.  If you are breastfeeding, your period should return anytime between 8 weeks after delivery and the time that you stop breastfeeding. If you are not breastfeeding, your period should return 6-8 weeks after delivery. PERINEAL CARE The perineal area, or perineum, is the part of your body between your thighs. After delivery, this area needs special care. Follow these instructions as told by your health care provider.  Take warm tub baths for 15-20 minutes.  Use medicated pads and pain-relieving sprays and creams as told.  Do not use tampons or douches until vaginal bleeding has stopped.  Each time you go to the bathroom: ? Use a peri bottle. ? Change your pad. ? Use towelettes in place of toilet paper until your stitches have healed.  Do Kegel exercises every day. Kegel exercises help to maintain the muscles that support the vagina, bladder, and bowels. You can do these exercises while you are standing, sitting, or lying down. To do Kegel exercises: ? Tighten the muscles of your abdomen and the muscles that surround your birth canal. ? Hold for a few seconds. ? Relax. ? Repeat until you have done this 5 times in a row.  To prevent hemorrhoids from developing or getting worse: ? Drink  enough fluid to keep your urine clear or pale yellow. ? Avoid straining when having a bowel movement. ? Take over-the-counter medicines and stool softeners as told by your health care provider.  BREAST CARE  Wear a tight-fitting bra.  Avoid taking over-the-counter pain medicine for breast discomfort.  Apply ice to the breasts to help with discomfort  as needed: ? Put ice in a plastic bag. ? Place a towel between your skin and the bag. ? Leave the ice on for 20 minutes or as told by your health care provider.  NUTRITION  Eat a well-balanced diet.  Do not try to lose weight quickly by cutting back on calories.  Take your prenatal vitamins until your postpartum checkup or until your health care provider tells you to stop.  POSTPARTUM DEPRESSION You may find yourself crying for no apparent reason and unable to cope with all of the changes that come with having a newborn. This mood is called postpartum depression. Postpartum depression happens because your hormone levels change after delivery. If you have postpartum depression, get support from your partner, friends, and family. If the depression does not go away on its own after several weeks, contact your health care provider. BREAST SELF-EXAM Do a breast self-exam each month, at the same time of the month. If you are breastfeeding, check your breasts just after a feeding, when your breasts are less full. If you are breastfeeding and your period has started, check your breasts on day 5, 6, or 7 of your period. Report any lumps, bumps, or discharge to your health care provider. Know that breasts are normally lumpy if you are breastfeeding. This is temporary, and it is not a health risk. INTIMACY AND SEXUALITY Avoid sexual activity for at least 3-4 weeks after delivery or until the brownish-red vaginal flow is completely gone. If you want to avoid pregnancy, use some form of birth control. You can get pregnant after delivery, even if you have  not had your period. SEEK MEDICAL CARE IF:  You feel unable to cope with the changes that a child brings to your life, and these feelings do not go away after several weeks.  You notice a lump, a bump, or discharge on your breast.  SEEK IMMEDIATE MEDICAL CARE IF:  Blood soaks your pad in 1 hour or less.  You have: ? Severe pain or cramping in your lower abdomen. ? A bad-smelling vaginal discharge. ? A fever that is not controlled by medicine. ? A fever, and an area of your breast is red and sore. ? Pain or redness in your calf. ? Sudden, severe chest pain. ? Shortness of breath. ? Painful or bloody urination. ? Problems with your vision.  You vomit for 12 hours or longer.  You develop a severe headache.  You have serious thoughts about hurting yourself, your child, or anyone else.  This information is not intended to replace advice given to you by your health care provider. Make sure you discuss any questions you have with your health care provider. Document Released: 09/28/2000 Document Revised: 03/08/2016 Document Reviewed: 04/04/2015 Elsevier Interactive Patient Education  2017 [employer] Postpartum Depression and Baby Blues The postpartum period begins right after the birth of a baby. During this time, there is often a great amount of joy and excitement. It is also a time of many changes in the life of the parents. Regardless of how many times a mother gives birth, each child brings new challenges and dynamics to the family. It is not unusual to have feelings of excitement along with confusing shifts in moods, emotions, and thoughts. All mothers are at risk of developing postpartum depression or the "baby blues." These mood changes can occur right after giving birth, or they may occur many months after giving birth. The baby blues or postpartum depression can be mild or severe.  Additionally, postpartum depression can go away rather quickly, or it can be a long-term  condition. What are the causes? Raised hormone levels and the rapid drop in those levels are thought to be a main cause of postpartum depression and the baby blues. A number of hormones change during and after pregnancy. Estrogen and progesterone usually decrease right after the delivery of your baby. The levels of thyroid hormone and various cortisol steroids also rapidly drop. Other factors that play a role in these mood changes include major life events and genetics. What increases the risk? If you have any of the following risks for the baby blues or postpartum depression, know what symptoms to watch out for during the postpartum period. Risk factors that may increase the likelihood of getting the baby blues or postpartum depression include:  Having a personal or family history of depression.  Having depression while being pregnant.  Having premenstrual mood issues or mood issues related to oral contraceptives.  Having a lot of life stress.  Having marital conflict.  Lacking a social support network.  Having a baby with special needs.  Having health problems, such as diabetes.  What are the signs or symptoms? Symptoms of baby blues include:  Brief changes in mood, such as going from extreme happiness to sadness.  Decreased concentration.  Difficulty sleeping.  Crying spells, tearfulness.  Irritability.  Anxiety.  Symptoms of postpartum depression typically begin within the first month after giving birth. These symptoms include:  Difficulty sleeping or excessive sleepiness.  Marked weight loss.  Agitation.  Feelings of worthlessness.  Lack of interest in activity or food.  Postpartum psychosis is a very serious condition and can be dangerous. Fortunately, it is rare. Displaying any of the following symptoms is cause for immediate medical attention. Symptoms of postpartum psychosis include:  Hallucinations and delusions.  Bizarre or disorganized  behavior.  Confusion or disorientation.  How is this diagnosed? A diagnosis is made by an evaluation of your symptoms. There are no medical or lab tests that lead to a diagnosis, but there are various questionnaires that a health care provider may use to identify those with the baby blues, postpartum depression, or psychosis. Often, a screening tool called the [place] Postnatal Depression Scale is used to diagnose depression in the postpartum period. How is this treated? The baby blues usually goes away on its own in 1-2 weeks. Social support is often all that is needed. You will be encouraged to get adequate sleep and rest. Occasionally, you may be given medicines to help you sleep. Postpartum depression requires treatment because it can last several months or longer if it is not treated. Treatment may include individual or group therapy, medicine, or both to address any social, physiological, and psychological factors that may play a role in the depression. Regular exercise, a healthy diet, rest, and social support may also be strongly recommended. Postpartum psychosis is more serious and needs treatment right away. Hospitalization is often needed. Follow these instructions at home:  Get as much rest as you can. Nap when the baby sleeps.  Exercise regularly. Some women find yoga and walking to be beneficial.  Eat a balanced and nourishing diet.  Do little things that you enjoy. Have a cup of tea, take a bubble bath, read your favorite magazine, or listen to your favorite music.  Avoid alcohol.  Ask for help with household chores, cooking, grocery shopping, or running errands as needed. Do not try to do everything.  Talk to people close  to you about how you are feeling. Get support from your partner, family members, friends, or other new moms.  Try to stay positive in how you think. Think about the things you are grateful for.  Do not spend a lot of time alone.  Only take  over-the-counter or prescription medicine as directed by your health care provider.  Keep all your postpartum appointments.  Let your health care provider know if you have any concerns. Contact a health care provider if: You are having a reaction to or problems with your medicine. Get help right away if:  You have suicidal feelings.  You think you may harm the baby or someone else. This information is not intended to replace advice given to you by your health care provider. Make sure you discuss any questions you have with your health care provider. Document Released: 07/05/2004 Document Revised: 03/08/2016 Document Reviewed: 07/13/2013 Elsevier Interactive Patient Education  2017 [employer] Breastfeeding Choosing to breastfeed is one of the best decisions you can make for yourself and your baby. A change in hormones during pregnancy causes your breasts to make breast milk in your milk-producing glands. Hormones prevent breast milk from being released before your baby is born. They also prompt milk flow after birth. Once breastfeeding has begun, thoughts of your baby, as well as his or her sucking or crying, can stimulate the release of milk from your milk-producing glands. Benefits of breastfeeding Research shows that breastfeeding offers many health benefits for infants and mothers. It also offers a cost-free and convenient way to feed your baby. For your baby  Your first milk (colostrum) helps your baby's digestive system to function better.  Special cells in your milk (antibodies) help your baby to fight off infections.  Breastfed babies are less likely to develop asthma, allergies, obesity, or type 2 diabetes. They are also at lower risk for sudden infant death syndrome (SIDS).  Nutrients in breast milk are better able to meet your babys needs compared to infant formula.  Breast milk improves your baby's brain development. For you  Breastfeeding helps to create a very  special bond between you and your baby.  Breastfeeding is convenient. Breast milk costs nothing and is always available at the correct temperature.  Breastfeeding helps to burn calories. It helps you to lose the weight that you gained during pregnancy.  Breastfeeding makes your uterus return faster to its size before pregnancy. It also slows bleeding (lochia) after you give birth.  Breastfeeding helps to lower your risk of developing type 2 diabetes, osteoporosis, rheumatoid arthritis, cardiovascular disease, and breast, ovarian, uterine, and endometrial cancer later in life. Breastfeeding basics Starting breastfeeding  Find a comfortable place to sit or lie down, with your neck and back well-supported.  Place a pillow or a rolled-up blanket under your baby to bring him or her to the level of your breast (if you are seated). Nursing pillows are specially designed to help support your arms and your baby while you breastfeed.  Make sure that your baby's tummy (abdomen) is facing your abdomen.  Gently massage your breast. With your fingertips, massage from the outer edges of your breast inward toward the nipple. This encourages milk flow. If your milk flows slowly, you may need to continue this action during the feeding.  Support your breast with 4 fingers underneath and your thumb above your nipple (make the letter "C" with your hand). Make sure your fingers are well away from your nipple and your babys mouth.  Stroke your baby's lips gently with your finger or nipple.  When your baby's mouth is open wide enough, quickly bring your baby to your breast, placing your entire nipple and as much of the areola as possible into your baby's mouth. The areola is the colored area around your nipple. ? More areola should be visible above your baby's upper lip than below the lower lip. ? Your baby's lips should be opened and extended outward (flanged) to ensure an adequate, comfortable latch. ? Your  baby's tongue should be between his or her lower gum and your breast.  Make sure that your baby's mouth is correctly positioned around your nipple (latched). Your baby's lips should create a seal on your breast and be turned out (everted).  It is common for your baby to suck about 2-3 minutes in order to start the flow of breast milk. Latching Teaching your baby how to latch onto your breast properly is very important. An improper latch can cause nipple pain, decreased milk supply, and poor weight gain in your baby. Also, if your baby is not latched onto your nipple properly, he or she may swallow some air during feeding. This can make your baby fussy. Burping your baby when you switch breasts during the feeding can help to get rid of the air. However, teaching your baby to latch on properly is still the best way to prevent fussiness from swallowing air while breastfeeding. Signs that your baby has successfully latched onto your nipple  Silent tugging or silent sucking, without causing you pain. Infant's lips should be extended outward (flanged).  Swallowing heard between every 3-4 sucks once your milk has started to flow (after your let-down milk reflex occurs).  Muscle movement above and in front of his or her ears while sucking.  Signs that your baby has not successfully latched onto your nipple  Sucking sounds or smacking sounds from your baby while breastfeeding.  Nipple pain.  If you think your baby has not latched on correctly, slip your finger into the corner of your babys mouth to break the suction and place it between your baby's gums. Attempt to start breastfeeding again. Signs of successful breastfeeding Signs from your baby  Your baby will gradually decrease the number of sucks or will completely stop sucking.  Your baby will fall asleep.  Your baby's body will relax.  Your baby will retain a small amount of milk in his or her mouth.  Your baby will let go of your  breast by himself or herself.  Signs from you  Breasts that have increased in firmness, weight, and size 1-3 hours after feeding.  Breasts that are softer immediately after breastfeeding.  Increased milk volume, as well as a change in milk consistency and color by the fifth day of breastfeeding.  Nipples that are not sore, cracked, or bleeding.  Signs that your baby is getting enough milk  Wetting at least 1-2 diapers during the first 24 hours after birth.  Wetting at least 5-6 diapers every 24 hours for the first week after birth. The urine should be clear or pale yellow by the age of 5 days.  Wetting 6-8 diapers every 24 hours as your baby continues to grow and develop.  At least 3 stools in a 24-hour period by the age of 5 days. The stool should be soft and yellow.  At least 3 stools in a 24-hour period by the age of 7 days. The stool should be seedy and yellow.  No loss of weight greater than 10% of birth weight during the first 3 days of life.  Average weight gain of 4-7 oz (113-198 g) per week after the age of 4 days.  Consistent daily weight gain by the age of 5 days, without weight loss after the age of 2 weeks. After a feeding, your baby may spit up a small amount of milk. This is normal. Breastfeeding frequency and duration Frequent feeding will help you make more milk and can prevent sore nipples and extremely full breasts (breast engorgement). Breastfeed when you feel the need to reduce the fullness of your breasts or when your baby shows signs of hunger. This is called "breastfeeding on demand." Signs that your baby is hungry include:  Increased alertness, activity, or restlessness.  Movement of the head from side to side.  Opening of the mouth when the corner of the mouth or cheek is stroked (rooting).  Increased sucking sounds, smacking lips, cooing, sighing, or squeaking.  Hand-to-mouth movements and sucking on fingers or hands.  Fussing or crying.  Avoid  introducing a pacifier to your baby in the first 4-6 weeks after your baby is born. After this time, you may choose to use a pacifier. Research has shown that pacifier use during the first year of a baby's life decreases the risk of sudden infant death syndrome (SIDS). Allow your baby to feed on each breast as long as he or she wants. When your baby unlatches or falls asleep while feeding from the first breast, offer the second breast. Because newborns are often sleepy in the first few weeks of life, you may need to awaken your baby to get him or her to feed. Breastfeeding times will vary from baby to baby. However, the following rules can serve as a guide to help you make sure that your baby is properly fed:  Newborns (babies 38 weeks of age or younger) may breastfeed every 1-3 hours.  Newborns should not go without breastfeeding for longer than 3 hours during the day or 5 hours during the night.  You should breastfeed your baby a minimum of 8 times in a 24-hour period.  Breast milk pumping Pumping and storing breast milk allows you to make sure that your baby is exclusively fed your breast milk, even at times when you are unable to breastfeed. This is especially important if you go back to work while you are still breastfeeding, or if you are not able to be present during feedings. Your lactation consultant can help you find a method of pumping that works best for you and give you guidelines about how long it is safe to store breast milk. Caring for your breasts while you breastfeed Nipples can become dry, cracked, and sore while breastfeeding. The following recommendations can help keep your breasts moisturized and healthy:  Avoid using soap on your nipples.  Wear a supportive bra designed especially for nursing. Avoid wearing underwire-style bras or extremely tight bras (sports bras).  Air-dry your nipples for 3-4 minutes after each feeding.  Use only cotton bra pads to absorb leaked breast  milk. Leaking of breast milk between feedings is normal.  Use lanolin on your nipples after breastfeeding. Lanolin helps to maintain your skin's normal moisture barrier. Pure lanolin is not harmful (not toxic) to your baby. You may also hand express a few drops of breast milk and gently massage that milk into your nipples and allow the milk to air-dry.  In the first few weeks after giving  birth, some women experience breast engorgement. Engorgement can make your breasts feel heavy, warm, and tender to the touch. Engorgement peaks within 3-5 days after you give birth. The following recommendations can help to ease engorgement:  Completely empty your breasts while breastfeeding or pumping. You may want to start by applying warm, moist heat (in the shower or with warm, water-soaked hand towels) just before feeding or pumping. This increases circulation and helps the milk flow. If your baby does not completely empty your breasts while breastfeeding, pump any extra milk after he or she is finished.  Apply ice packs to your breasts immediately after breastfeeding or pumping, unless this is too uncomfortable for you. To do this: ? Put ice in a plastic bag. ? Place a towel between your skin and the bag. ? Leave the ice on for 20 minutes, 2-3 times a day.  Make sure that your baby is latched on and positioned properly while breastfeeding.  If engorgement persists after 48 hours of following these recommendations, contact your health care provider or a Advertising copywriter. Overall health care recommendations while breastfeeding  Eat 3 healthy meals and 3 snacks every day. Well-nourished mothers who are breastfeeding need an additional 450-500 calories a day. You can meet this requirement by increasing the amount of a balanced diet that you eat.  Drink enough water to keep your urine pale yellow or clear.  Rest often, relax, and continue to take your prenatal vitamins to prevent fatigue, stress, and low  vitamin and mineral levels in your body (nutrient deficiencies).  Do not use any products that contain nicotine or tobacco, such as cigarettes and e-cigarettes. Your baby may be harmed by chemicals from cigarettes that pass into breast milk and exposure to secondhand smoke. If you need help quitting, ask your health care provider.  Avoid alcohol.  Do not use illegal drugs or marijuana.  Talk with your health care provider before taking any medicines. These include over-the-counter and prescription medicines as well as vitamins and herbal supplements. Some medicines that may be harmful to your baby can pass through breast milk.  It is possible to become pregnant while breastfeeding. If birth control is desired, ask your health care provider about options that will be safe while breastfeeding your baby. Where to find more information: [employer]: www.llli.org Contact a health care provider if:  You feel like you want to stop breastfeeding or have become frustrated with breastfeeding.  Your nipples are cracked or bleeding.  Your breasts are red, tender, or warm.  You have: ? Painful breasts or nipples. ? A swollen area on either breast. ? A fever or chills. ? Nausea or vomiting. ? Drainage other than breast milk from your nipples.  Your breasts do not become full before feedings by the fifth day after you give birth.  You feel sad and depressed.  Your baby is: ? Too sleepy to eat well. ? Having trouble sleeping. ? More than 24 week old and wetting fewer than 6 diapers in a 24-hour period. ? Not gaining weight by 89 days of age.  Your baby has fewer than 3 stools in a 24-hour period.  Your baby's skin or the white parts of his or her eyes become yellow. Get help right away if:  Your baby is overly tired (lethargic) and does not want to wake up and feed.  Your baby develops an unexplained fever. Summary  Breastfeeding offers many health benefits for infant  and mothers.  Try to  breastfeed your infant when he or she shows early signs of hunger.  Gently tickle or stroke your baby's lips with your finger or nipple to allow the baby to open his or her mouth. Bring the baby to your breast. Make sure that much of the areola is in your baby's mouth. Offer one side and burp the baby before you offer the other side.  Talk with your health care provider or lactation consultant if you have questions or you face problems as you breastfeed. This information is not intended to replace advice given to you by your health care provider. Make sure you discuss any questions you have with your health care provider. Document Released: 10/01/2005 Document Revised: 11/02/2016 Document Reviewed: 11/02/2016 Elsevier Interactive Patient Education  [employer].

## 2018-03-13 NOTE — Discharge Summary (Signed)
  Obstetric Discharge Summary  Patient ID: Joanna Lynch MRN: [id number] DOB/AGE: 32/02/1986 32 y.o.   Date of Admission: 03/11/2018 Doreene Burke, CNM Charlena Cross, MD)  Date of Discharge:  03/13/18 Serafina Royals, CNM Horton Marshall, MD)  Admitting Diagnosis: Induction of labor at [redacted]w[redacted]d  Secondary Diagnosis: None  Mode of Delivery: Normal spontaneous vaginal delivery     Discharge Diagnosis: No other diagnosis   Intrapartum Procedures: Atificial rupture of membranes, epidural and pitocin augmentation   Post partum procedures: None  Complications: First degree perineal laceration, repaired   Brief Hospital Course   Joanna Lynch is a [id number] who had a SVD on 03/11/2018;  for further details of this birth, please refer to the delivey note.  Patient had an uncomplicated postpartum course.  By time of discharge on PPD#2, her pain was controlled on oral pain medications; she had appropriate lochia and was ambulating, voiding without difficulty and tolerating regular diet.  She was deemed stable for discharge to home.    Labs:  CBC Latest Ref Rng & Units 03/12/2018 03/11/2018 12/16/2017  WBC 3.6 - 11.0 K/uL 11.3(H) 10.0 10.5  Hemoglobin 12.0 - 16.0 g/dL 11.5(L) 11.8(L) 12.5  Hematocrit 35.0 - 47.0 % 31.8(L) 33.3(L) 37.4  Platelets 150 - 440 K/uL 191 212 189   O POS  Physical exam:   Temp:  [98 F (36.7 C)-98.8 F (37.1 C)] 98 F (36.7 C) (05/30 0749) Pulse Rate:  [57-77] 66 (05/30 0749) Resp:  [18-20] 18 (05/30 0749) BP: (108-123)/(58-72) 111/66 (05/30 0749) SpO2:  [96 %-99 %] 96 % (05/30 0749)  General: alert and no distress  Lochia: appropriate  Abdomen: soft, NT  Uterine Fundus: firm  Perineum: healing well, no significant drainage, no dehiscence, no significant erythema  Extremities: No evidence of DVT seen on physical exam. No lower extremity edema.  Discharge Instructions: Per After Visit Summary.  Activity: Advance as tolerated. Pelvic rest for  6 weeks.  Also refer to After Visit Summary  Diet: Regular  Medications:  Allergies as of 03/13/2018   No Known Allergies     Medication List    STOP taking these medications   guaiFENesin-codeine 100-10 MG/5ML syrup     TAKE these medications   ferrous sulfate 325 (65 FE) MG tablet Take 1 tablet (325 mg total) by mouth daily with breakfast. Start taking on:  03/14/2018   ibuprofen 600 MG tablet Commonly known as:  ADVIL,MOTRIN Take 1 tablet (600 mg total) by mouth every 6 (six) hours.   pantoprazole 40 MG tablet Commonly known as:  PROTONIX Take 1 tablet (40 mg total) by mouth daily.   PRENATAL 1 PO Take by mouth daily.      Outpatient follow up:  Follow-up Information    Doreene Burke, CNM. Schedule an appointment as soon as possible for a visit in 6 week(s).   Specialties:  Certified Nurse Midwife, Radiology Why:  Please schedule six (6) week PPV with AT.  Contact information: [address] [place] [address] [phone]          Postpartum contraception: IUD; Mirena  Discharged Condition: stable  Discharged to: home   Newborn Data:  Disposition:home with mother  Apgars: APGAR (1 MIN): 8   APGAR (5 MINS): 9    Baby Feeding: Breast   Gunnar Bulla, CNM [facility], [facility] 03/13/18 7:52 AM

## 2018-03-13 NOTE — Progress Notes (Signed)
Patient discharged home with infant. Discharge instructions, prescriptions and follow up appointment given to and reviewed with patient. Patient verbalized understanding. Patient wheeled out with infant by auxiliary.  

## 2018-04-23 ENCOUNTER — Ambulatory Visit (INDEPENDENT_AMBULATORY_CARE_PROVIDER_SITE_OTHER): Payer: BC Managed Care – PPO | Admitting: Certified Nurse Midwife

## 2018-04-23 NOTE — Progress Notes (Signed)
Pt is here for a post partum visit. Is breast feeding. Has not had a period.Pt is interested in Mirena. Has not resumed intercourse.Screening 2

## 2018-04-23 NOTE — Patient Instructions (Signed)

## 2018-04-23 NOTE — Progress Notes (Signed)
Subjective:    Daneil DolinKatie Aldridge Kastens is a 32 y.o. [id number] Caucasian female who presents for a postpartum visit. She is 6 weeks postpartum following a spontaneous vaginal delivery at 40.6 gestational weeks. Anesthesia: epidural. I have fully reviewed the prenatal and intrapartum course. Postpartum course has been WNL. Baby's course has been WNL. Baby is feeding by breast. Bleeding no bleeding. Bowel function is normal. Bladder function is normal. Patient is not sexually active. Last sexual activity: prior to delivery. Contraception method is plans IUD. Postpartum depression screening: negative. Score 2.  Last pap 07/15/16 and was negative.  The following portions of the patient's history were reviewed and updated as appropriate: allergies, current medications, past medical history, past surgical history and problem list.  Review of Systems Pertinent items are noted in HPI.   There were no vitals filed for this visit. No LMP recorded.  Objective:   General:  alert, cooperative and no distress   Breasts:  deferred, no complaints  Lungs: clear to auscultation bilaterally  Heart:  regular rate and rhythm  Abdomen: soft, nontender   Vulva: normal  Vagina: normal vagina  Cervix:  closed  Corpus: Well-involuted  Adnexa:  Non-palpable  Rectal Exam: no hemorrhoids        Assessment:   Postpartum exam 6 wks s/p SVD Breastfeeding Depression screening Contraception counseling   Plan:  : plans IUD Follow up in: prn  for IUD placement  or earlier if needed. Annual physical in 6 months   Doreene BurkeAnnie Verdis Bassette, CNM

## 2018-05-12 ENCOUNTER — Ambulatory Visit (INDEPENDENT_AMBULATORY_CARE_PROVIDER_SITE_OTHER): Payer: BC Managed Care – PPO | Admitting: Certified Nurse Midwife

## 2018-05-12 VITALS — BP 110/83 | HR 76 | Wt 176.0 lb

## 2018-05-12 DIAGNOSIS — Z30430 Encounter for insertion of intrauterine contraceptive device: Secondary | ICD-10-CM

## 2018-05-12 DIAGNOSIS — Z3202 Encounter for pregnancy test, result negative: Secondary | ICD-10-CM

## 2018-05-12 LAB — POCT URINE PREGNANCY: Preg Test, Ur: NEGATIVE

## 2018-05-12 NOTE — Patient Instructions (Signed)

## 2018-05-12 NOTE — Progress Notes (Signed)
  GYNECOLOGY OFFICE PROCEDURE NOTE  Daneil DolinKatie Aldridge Rone is a 32 y.o. [phone] here for Mirena IUD insertion. No GYN concerns.  Last pap smear was on 07/15/2016 and was normal.  IUD Insertion Procedure Note Patient identified, informed consent performed, consent signed.   Discussed risks of irregular bleeding, cramping, infection, malpositioning or misplacement of the IUD outside the uterus which may require further procedure such as laparoscopy. Time out was performed.  Urine pregnancy test negative.  Speculum placed in the vagina.  Cervix visualized.  Cleaned with Betadine x 2.  Grasped anteriorly with a single tooth tenaculum.  Uterus sounded to 8 cm.  Mirena IUD placed per manufacturer's recommendations.  Strings trimmed to 3 cm. Tenaculum was removed, good hemostasis noted.  Patient tolerated procedure well.   Patient was given post-procedure instructions.  She was advised to have backup contraception for one week.  Patient was also asked to check IUD strings periodically and follow up in 4 weeks for IUD check.   Doreene BurkeAnnie Marnisha Stampley, CNM

## 2018-06-09 ENCOUNTER — Encounter: Payer: BC Managed Care – PPO | Admitting: Certified Nurse Midwife

## 2018-06-11 ENCOUNTER — Ambulatory Visit (INDEPENDENT_AMBULATORY_CARE_PROVIDER_SITE_OTHER): Payer: BC Managed Care – PPO | Admitting: Certified Nurse Midwife

## 2018-06-11 VITALS — BP 112/78 | HR 65 | Ht 68.0 in | Wt 173.5 lb

## 2018-06-11 DIAGNOSIS — Z30431 Encounter for routine checking of intrauterine contraceptive device: Secondary | ICD-10-CM

## 2018-06-11 NOTE — Progress Notes (Signed)
GYNECOLOGY OFFICE PROGRESS NOTE  History:  [age] y.o. [id number] here today for today for IUD string check; Mirena IUD was placed  05/12/18. No complaints about the IUD, no concerning side effects.  The following portions of the patient's history were reviewed and updated as appropriate: allergies, current medications, past family history, past medical history, past social history, past surgical history and problem list. Last pap smear on 07/15/16 was normal.   Review of Systems:  Pertinent items are noted in HPI.   Objective:  Physical Exam currently breastfeeding. CONSTITUTIONAL: Well-developed, well-nourished female in no acute distress.  HENT:  Normocephalic, atraumatic. External right and left ear normal. Oropharynx is clear and moist EYES: Conjunctivae and EOM are normal. Pupils are equal, round, and reactive to light. No scleral icterus.  NECK: Normal range of motion, supple, no masses CARDIOVASCULAR: Normal heart rate noted RESPIRATORY: Effort and breath sounds normal, no problems with respiration noted ABDOMEN: Soft, no distention noted.   PELVIC: Normal appearing external genitalia; normal appearing vaginal mucosa and cervix.  IUD strings visualized, about 3 cm in length outside cervix.   Assessment & Plan:  Normal IUD check. Patient to keep IUD in place for five years; can come in for removal if she desires pregnancy within the next five years. Routine preventative health maintenance measures emphasized.   Doreene BurkeAnnie Jaimeson Gopal, CNM

## 2018-06-11 NOTE — Patient Instructions (Signed)

## 2018-10-24 ENCOUNTER — Encounter: Payer: BC Managed Care – PPO | Admitting: Certified Nurse Midwife

## 2018-10-27 ENCOUNTER — Other Ambulatory Visit (HOSPITAL_COMMUNITY)
Admission: RE | Admit: 2018-10-27 | Discharge: 2018-10-27 | Disposition: A | Discharge status: Home / Self Care | Payer: BC Managed Care – PPO | Source: Ambulatory Visit | Attending: Certified Nurse Midwife | Admitting: Certified Nurse Midwife

## 2018-10-27 ENCOUNTER — Ambulatory Visit (INDEPENDENT_AMBULATORY_CARE_PROVIDER_SITE_OTHER): Payer: BC Managed Care – PPO | Admitting: Certified Nurse Midwife

## 2018-10-27 ENCOUNTER — Encounter: Payer: Self-pay | Admitting: Certified Nurse Midwife

## 2018-10-27 VITALS — BP 118/76 | HR 68 | Ht 68.0 in | Wt 158.2 lb

## 2018-10-27 DIAGNOSIS — Z01419 Encounter for gynecological examination (general) (routine) without abnormal findings: Secondary | ICD-10-CM

## 2018-10-27 DIAGNOSIS — Z124 Encounter for screening for malignant neoplasm of cervix: Secondary | ICD-10-CM

## 2018-10-27 NOTE — Patient Instructions (Addendum)
WE WOULD LOVE TO HEAR FROM YOU!!!!   Thank you Enzo Bi for visiting [facility].  Providing our patients with the best experience possible is really important to [place], and we hope that you felt that on your recent visit. The most valuable feedback we get comes from [place]!!    If you receive a survey please take a couple of minutes to let us know how we did.Thank you for continuing to trust [place] with your care.   [facility]   Preventive Care 18-39 Years, Female Preventive care refers to lifestyle choices and visits with your health care provider that can promote health and wellness. What does preventive care include?   A yearly physical exam. This is also called an annual well check.  Dental exams once or twice a year.  Routine eye exams. Ask your health care provider how often you should have your eyes checked.  Personal lifestyle choices, including: ? Daily care of your teeth and gums. ? Regular physical activity. ? Eating a healthy diet. ? Avoiding tobacco and drug use. ? Limiting alcohol use. ? Practicing safe sex. ? Taking vitamin and mineral supplements as recommended by your health care provider. What happens during an annual well check? The services and screenings done by your health care provider during your annual well check will depend on your age, overall health, lifestyle risk factors, and family history of disease. Counseling Your health care provider may ask you questions about your:  Alcohol use.  Tobacco use.  Drug use.  Emotional well-being.  Home and relationship well-being.  Sexual activity.  Eating habits.  Work and work Statistician.  Method of birth control.  Menstrual cycle.  Pregnancy history. Screening You may have the following tests or measurements:  Height, weight, and BMI.  Diabetes screening. This is done by checking your blood sugar (glucose) after you have not eaten for a while  (fasting).  Blood pressure.  Lipid and cholesterol levels. These may be checked every 5 years starting at age 6.  Skin check.  Hepatitis C blood test.  Hepatitis B blood test.  Sexually transmitted disease (STD) testing.  BRCA-related cancer screening. This may be done if you have a family history of breast, ovarian, tubal, or peritoneal cancers.  Pelvic exam and Pap test. This may be done every 3 years starting at age 65. Starting at age 58, this may be done every 5 years if you have a Pap test in combination with an HPV test. Discuss your test results, treatment options, and if necessary, the need for more tests with your health care provider. Vaccines Your health care provider may recommend certain vaccines, such as:  Influenza vaccine. This is recommended every year.  Tetanus, diphtheria, and acellular pertussis (Tdap, Td) vaccine. You may need a Td booster every 10 years.  Varicella vaccine. You may need this if you have not been vaccinated.  HPV vaccine. If you are 63 or younger, you may need three doses over 6 months.  Measles, mumps, and rubella (MMR) vaccine. You may need at least one dose of MMR. You may also need a second dose.  Pneumococcal 13-valent conjugate (PCV13) vaccine. You may need this if you have certain conditions and were not previously vaccinated.  Pneumococcal polysaccharide (PPSV23) vaccine. You may need one or two doses if you smoke cigarettes or if you have certain conditions.  Meningococcal vaccine. One dose is recommended if you are age 77-21 years and a Market researcher living in  a residence hall, or if you have one of several medical conditions. You may also need additional booster doses.  Hepatitis A vaccine. You may need this if you have certain conditions or if you travel or work in places where you may be exposed to hepatitis A.  Hepatitis B vaccine. You may need this if you have certain conditions or if you travel or work in  places where you may be exposed to hepatitis B.  Haemophilus influenzae type b (Hib) vaccine. You may need this if you have certain risk factors. Talk to your health care provider about which screenings and vaccines you need and how often you need them. This information is not intended to replace advice given to you by your health care provider. Make sure you discuss any questions you have with your health care provider. Document Released: 11/27/2001 Document Revised: 05/14/2017 Document Reviewed: 08/02/2015 Elsevier Interactive Patient Education  2019 [employer].

## 2018-10-27 NOTE — Progress Notes (Signed)
GYNECOLOGY ANNUAL PREVENTATIVE CARE ENCOUNTER NOTE  Subjective:   Joanna Lynch is a 33 y.o. [phone] female here for a routine annual gynecologic exam.  Current complaints: none.   Denies abnormal vaginal bleeding, discharge, pelvic pain, problems with intercourse or other gynecologic concerns.    Gynecologic History No LMP recorded. (Menstrual status: IUD). Contraception: IUD mirena Last Pap: 07/15/16 Results were: normal Last mammogram: n/a .  Obstetric History OB History  Gravida Para Term Preterm AB Living  2 2 2     2   SAB TAB Ectopic Multiple Live Births        0 2    # Outcome Date GA Lbr Len/2nd Weight Sex Delivery Anes PTL Lv  2 Term 03/11/18 [redacted]w[redacted]d / 00:08 8 lb 4.6 oz (3.76 kg) F Vag-Spont EPI  LIV  1 Term 09/17/16 [redacted]w[redacted]d / 01:56 8 lb 2.2 oz (3.69 kg) F Vag-Spont EPI  LIV    Past Medical History:  Diagnosis Date  . Appendicitis 10/2016   no surgery, was given medication    Past Surgical History:  Procedure Laterality Date  . NASAL SEPTUM SURGERY  2005    Current Outpatient Medications on File Prior to Visit  Medication Sig Dispense Refill  . levonorgestrel (MIRENA) 20 MCG/24HR IUD 1 each by Intrauterine route once.     No current facility-administered medications on file prior to visit.     No Known Allergies  Social History:  reports that she has never smoked. She has never used smokeless tobacco. She reports that she does not drink alcohol or use drugs.  Family History  Problem Relation Age of Onset  . Healthy Mother   . Hypertension Father   . Cancer Maternal Aunt        esophagus    The following portions of the patient's history were reviewed and updated as appropriate: allergies, current medications, past family history, past medical history, past social history, past surgical history and problem list.   Review of Systems Pertinent items noted in HPI and remainder of comprehensive ROS otherwise negative.   Objective:  BP 118/76   Pulse  68   Ht 5\' 8"  (1.727 m)   Wt 158 lb 4 oz (71.8 kg)   BMI 24.06 kg/m  CONSTITUTIONAL: Well-developed, well-nourished female in no acute distress.  HENT:  Normocephalic, atraumatic, External right and left ear normal. Oropharynx is clear and moist EYES: Conjunctivae and EOM are normal. Pupils are equal, round, and reactive to light. No scleral icterus.  NECK: Normal range of motion, supple, no masses.  Normal thyroid.  SKIN: Skin is warm and dry. No rash noted. Not diaphoretic. No erythema. No pallor. MUSCULOSKELETAL: Normal range of motion. No tenderness.  No cyanosis, clubbing, or edema.  2+ distal pulses. NEUROLOGIC: Alert and oriented to person, place, and time. Normal reflexes, muscle tone coordination. No cranial nerve deficit noted. PSYCHIATRIC: Normal mood and affect. Normal behavior. Normal judgment and thought content. CARDIOVASCULAR: Normal heart rate noted, regular rhythm RESPIRATORY: Clear to auscultation bilaterally. Effort and breath sounds normal, no problems with respiration noted. BREASTS: Symmetric in size. No masses, skin changes, nipple drainage, or lymphadenopathy. ABDOMEN: Soft, normal bowel sounds, no distention noted.  No tenderness, rebound or guarding.  PELVIC: Normal appearing external genitalia; normal appearing vaginal mucosa and cervix.  No abnormal discharge noted.  Pap smear obtained. Contact bleeding. Strings visualized  Normal uterine size, no other palpable masses, no uterine or adnexal tenderness.    Assessment and Plan:  Annual Well Women  exam  Will follow up results of pap smear and manage accordingly. Mammogram n/a Labs; declines at this time. Will schedule for lipid profile.  Routine preventative health maintenance measures emphasized. Please refer to After Visit Summary for other counseling recommendations.   Doreene Burke, CNM   Doreene Burke, CNM

## 2018-10-29 LAB — CYTOLOGY - PAP
DIAGNOSIS: NEGATIVE
HPV: NOT DETECTED

## 2019-09-30 ENCOUNTER — Other Ambulatory Visit: Payer: BC Managed Care – PPO

## 2019-09-30 ENCOUNTER — Other Ambulatory Visit: Payer: Self-pay

## 2019-09-30 ENCOUNTER — Ambulatory Visit: Payer: BC Managed Care – PPO | Attending: Internal Medicine

## 2019-09-30 DIAGNOSIS — Z20822 Contact with and (suspected) exposure to covid-19: Secondary | ICD-10-CM

## 2019-10-02 LAB — NOVEL CORONAVIRUS, NAA: SARS-CoV-2, NAA: DETECTED — AB

## 2022-11-13 NOTE — Progress Notes (Signed)
 Gynecology Annual Exam  PCP: No primary care provider on file.  Chief Complaint:  Chief Complaint  Patient presents with  . 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 Joanna Lynch is a 37 y.o. No obstetric history on file. who LMP was No LMP recorded. (Menstrual status: IUD)., presents today for her annual examination.  Her menses are infrequent since IUD. Dysmenorrhea irregularly. She does not have intermenstrual bleeding.  Would like IUD removed today.  Does not desire to replace.    She is single partner, contraception - IUD.  Last Pap: October 27, 2018. Results were: no abnormalities /neg HPV DNA   Screening: Last Mammogram: N/A age    -Family hx of breast cancer: denies -The patient intermittently does self-breast exams. -Family hx of ovarian cancer: denies -Family hx of colon cancer: denies  Tobacco use: The patient denies current or previous tobacco use. Alcohol use: none Exercise: very active - regular routine every day  The patient wears seatbelts: yes.    The patient reports that domestic violence in her life is absent.   History reviewed. No pertinent past medical history.    History reviewed. No pertinent surgical history.  Prior to Admission medications   Not on File    No Known Allergies  Gynecologic History:  No LMP recorded. (Menstrual status: IUD). No obstetric history on file.  Contraception: IUD:  Mirena  IUD placed 05/12/2018 Menarche: [age] History of abnormal pap smear: no History of STI: no  Obstetric History: No obstetric history on file.   OB History  Gravida Para Term Preterm AB Living  2 2 2  0 0 2  SAB IAB Ectopic Molar Multiple Live Births  0 0 0 0 0 2    # Outcome Date GA Lbr Len/2nd Weight Sex Delivery Anes PTL Lv  2 Term 03/11/18 [redacted]w[redacted]d / 00:08 3.76 kg (8 lb 4.6 oz) F Vag-Spont EPI  LIV     Name: [facility]     Apgar1: 8  Apgar5: 9  1 Term 09/17/16 [redacted]w[redacted]d  / 01:56 3.69 kg (8 lb 2.2 oz) F Vag-Spont EPI  LIV     Name: Hasler,GIRL Melesa     Apgar1: 8  Apgar5: 9     Social History   Socioeconomic History  . Marital status: Married  Tobacco Use  . Smoking status: Never  . Smokeless tobacco: Never  Substance and Sexual Activity  . Alcohol use: Never  . Drug use: Never  . Sexual activity: Yes    Partners: Male    Birth control/protection: None    Family History  Problem Relation Age of Onset  . Breast cancer Neg Hx     Review of Systems  Constitutional:  Negative for chills, fever and malaise/fatigue.  Respiratory:  Negative for cough and shortness of breath.   Cardiovascular:  Negative for chest pain.  Gastrointestinal:  Negative for abdominal pain, heartburn and nausea.  Genitourinary:  Negative for dysuria, frequency and urgency.  Musculoskeletal:  Negative for myalgias.  Skin:  Negative for rash.  Neurological:  Negative for dizziness and headaches.  Psychiatric/Behavioral:  Negative for depression and suicidal ideas.      Physical Exam BP (!) 146/88   Pulse 76   Ht 167.6 cm (5' 6)   Wt 73.9 kg (163 lb)   BMI 26.31 kg/m    PE Chaperone note: A chaperone was offered for the sensitive portions of the exam  but the patient/family declined   Physical Exam Genitourinary:     Vulva normal.     No vaginal discharge, erythema, tenderness or bleeding.      Right Adnexa: not tender and no mass present.    Left Adnexa: not tender and no mass present.    No cervical motion tenderness or discharge.     IUD strings visualized.     Uterus is not enlarged or tender.  Breasts:    Right: Normal. No mass or tenderness.     Left: Normal. No mass or tenderness.  Cardiovascular:     Rate and Rhythm: Normal rate and regular rhythm.  Pulmonary:     Effort: Pulmonary effort is normal.  Abdominal:     Palpations: Abdomen is soft.     Tenderness: There is no abdominal tenderness.  Musculoskeletal:        General: Normal range of  motion.     Cervical back: Normal range of motion.  Neurological:     Mental Status: She is alert and oriented to person, place, and time.  Skin:    General: Skin is warm and dry.     Capillary Refill: Capillary refill takes less than 2 seconds.  Psychiatric:        Mood and Affect: Mood normal.     Results: AUDIT Questionnaire Alcohol Use Q1: How often do you have a drink containing alcohol?: Never (11/13/2022  2:16 PM) Q2: How many drinks containing alcohol do you have on a typical day when you are drinking?: None (11/13/2022  2:16 PM) Q3: How often do you have six or more drinks on one occasion?: Never (11/13/2022  2:16 PM)     PHQ-9 from today's flowsheet  Patient Health Questionnaire (PHQ-9) Little interest or pleasure in doing things: Not At All Feeling down, depressed, or hopeless (or irritable for Teens only)?: Not At All Total Prescreening Score: 0 Trouble falling or staying asleep, or sleeping too much?: Several days Feeling tired or having little energy?: Not at all Poor appetite or overeating?: Not at all Feeling bad about yourself - or that you are a failure or have let yourself or your family down?: Not at all Trouble concentrating on things, such as reading the newspaper or watching television?: Several days Moving or speaking so slowly that other people could have noticed?  Or the opposite - being so fidgety or restless that you have been moving around a lot more than usual?: Not at all Thoughts that you would be better off dead, or of hurting yourself in some way?: Not at all How difficult have these problems made it for you do your work, take care of things at home, or get along with people?: Not difficult at all Total Score =: 2  Depression Severity and Treatment Recommendations:  0-4= None  5-9= Mild / Treatment: Support, educate to call if worse; return in one month  10-14= Moderate / Treatment: Support, watchful waiting; Antidepressant or Psychotherapy   15-19= Moderately severe / Treatment: Antidepressant OR Psychotherapy  >= 20 = Major depression, severe / Antidepressant AND Psychotherapy   PROCEDURE NOTE: Removal of IUD  Consent obtained Patient in dorsal lithotomy position Speculum inserted Cervix and IUD strings visualized Strings grasped with ringed forceps Gentle traction and IUD removed without difficulty Instruments removed.  Patient tolerated without issue.   Assessment: 37 y.o. No obstetric history on file. female here for routine annual gynecologic examination  Plan: Problem List Items Addressed This Visit   None Visit  Diagnoses     Cervical cancer screening    -  Primary   Relevant Orders   IGP, Aptima HPV - [facility]   Screening for HPV (human papillomavirus)       Relevant Orders   IGP, Aptima HPV - [facility]   Routine general medical examination at a health care facility       Relevant Orders   CBC w/auto Differential (3 Part)   Comprehensive Metabolic Panel (CMP)   Hemoglobin A1C   Lipid Panel w/calc LDL   Thyroid Stimulating-Hormone (TSH)       Screening: -- Blood pressure screen elevated: continued to monitor. -- Weight screening: normal -- Depression screening negative (PHQ-9) -- Nutrition: normal -- cholesterol screening: will obtain -- family history of breast cancer screening: done. not at high risk. -- no evidence of domestic violence or intimate partner violence. -- STD screening: gonorrhea/chlamydia NAAT not collected per patient request. -- pap smear collected per ASCCP guidelines   Reproductive life planning:  Contraceptive options were reviewed, including hormonal methods, both combination (pill, patch, vaginal ring) and progesterone-only (pill, Depo Provera and Nexplanon), intrauterine devices (Mirena , Skyla and Paraguard), barrier methods (condoms, diaphragm) and female/female sterilization.  The mechanisms, risks, benefits and side effects of all methods were discussed.  All questions  have been answered to her satisfaction.  - Patient specific medical issues: None - Her individual medical and surgical history was reviewed. Her life plans considering conception were discussed.  - She has chosen natural family planning (NFP). She is ok if a pregnancy occurs and does not desire a permanent method at this time.   - She will let me know if she desires another method.  Kyleena IUD may be considered if she desires a lower dose IUD option.   Return in about 1 year (around 11/14/2023) for annual exam.    Attestation Statement:   I personally performed the service, non-incident to. (WP)   ANNA ROSALINE PILLOW, CNM [facility]

## 2023-10-16 NOTE — L&D Delivery Note (Signed)
 Delivery Note  Joanna Lynch is a [id number] at [redacted]w[redacted]d with an LMP of 07/29/23, not consistent with US  at [redacted]w[redacted]d.   First Stage: Labor onset: 1630 Augmentation: misoprostol  and oxytocin  Analgesia /Anesthesia intrapartum: Epidural GBS: Negative/-- (07/02 2156)  IP Antibiotics: abx: none  Second Stage: Complete dilation at 1758 Onset of pushing at 1758 FHR second stage Baseline: 135 bpm with variable decels 20 minutes prior to pushing   [name] presented to L&D for SROM She was 1/50/-2. She progressed  to C/C/+2 with a spontaneous urge to push.  She pushed  effectively over approximately 2 minutes for a spontaneous vaginal birth. Delivery of a viable baby girl on 04/21/2024 . by CNM. Delivery of fetal head in position: Occiput,, Anterior position with restitution to position: Left,, Occiput,, Transverse no nuchal cord;  Anterior then posterior shoulders delivered easily with gentle downward traction. Baby placed on mom's chest, and attended to by baby RN. Cord double clamped after cessation of pulsation, cut by FOB    Third Stage: Oxytocin  bolus started after delivery of infant for hemorrhage prophylaxis  Placenta delivered [facility] intact with 3 VC @ 1810 Placenta disposition: To Pathology: No  Uterine tone firm / exam; vaginal bleeding: moderate  Laceration: 2nd degree and vaginal laceration identified  Anesthesia for repair: procedures; anesthesia: epidural Repair suture type: 2.0 vicryl Est. Blood Loss (mL): 215  Complications:Diagnoses; OB-GYN delivery complications: prolonged rupture of membranes  Mom to postpartum.  Baby to Couplet care / Skin to Skin.  Newborn: Information for the patient's newborn:  [name], [name] [[id number]]  Live born female  Birth Weight: 7 lb 5.1 oz (3320 g) APGAR: 8, 9  Newborn Delivery   Birth date/time: 04/21/2024 18:00:00 Delivery type: Vaginal, Spontaneous      Feeding planned: breast feeding  ---------- Bobbette Brunswick,  CNM Certified Nurse Midwife [facility]

## 2023-10-18 DIAGNOSIS — O3410 Maternal care for benign tumor of corpus uteri, unspecified trimester: Secondary | ICD-10-CM | POA: Diagnosis present

## 2023-10-18 DIAGNOSIS — D259 Leiomyoma of uterus, unspecified: Secondary | ICD-10-CM | POA: Diagnosis present

## 2023-10-18 DIAGNOSIS — O09521 Supervision of elderly multigravida, first trimester: Secondary | ICD-10-CM | POA: Diagnosis present

## 2023-10-22 LAB — OB RESULTS CONSOLE RUBELLA ANTIBODY, IGM: Rubella: IMMUNE

## 2023-10-22 LAB — OB RESULTS CONSOLE VARICELLA ZOSTER ANTIBODY, IGG: Varicella: IMMUNE

## 2023-10-22 LAB — OB RESULTS CONSOLE HEPATITIS B SURFACE ANTIGEN: Hepatitis B Surface Ag: NEGATIVE

## 2024-02-10 LAB — OB RESULTS CONSOLE HEPATITIS B SURFACE ANTIGEN: Hepatitis B Surface Ag: NEGATIVE

## 2024-02-10 LAB — OB RESULTS CONSOLE RPR: RPR: NONREACTIVE

## 2024-02-10 LAB — OB RESULTS CONSOLE HIV ANTIBODY (ROUTINE TESTING): HIV: NONREACTIVE

## 2024-04-15 LAB — OB RESULTS CONSOLE GC/CHLAMYDIA
Chlamydia: NEGATIVE
Neisseria Gonorrhea: NEGATIVE

## 2024-04-15 LAB — OB RESULTS CONSOLE GBS: GBS: NEGATIVE

## 2024-04-20 ENCOUNTER — Encounter: Payer: Self-pay | Admitting: Obstetrics and Gynecology

## 2024-04-20 ENCOUNTER — Other Ambulatory Visit: Payer: Self-pay

## 2024-04-20 ENCOUNTER — Inpatient Hospital Stay
Admission: EM | Admit: 2024-04-20 | Discharge: 2024-04-22 | DRG: 807 | Disposition: A | Discharge status: Home / Self Care | Payer: Self-pay | Attending: Obstetrics | Admitting: Obstetrics

## 2024-04-20 DIAGNOSIS — O4292 Full-term premature rupture of membranes, unspecified as to length of time between rupture and onset of labor: Secondary | ICD-10-CM | POA: Diagnosis present

## 2024-04-20 DIAGNOSIS — D259 Leiomyoma of uterus, unspecified: Secondary | ICD-10-CM | POA: Diagnosis present

## 2024-04-20 DIAGNOSIS — O3413 Maternal care for benign tumor of corpus uteri, third trimester: Secondary | ICD-10-CM | POA: Diagnosis present

## 2024-04-20 DIAGNOSIS — O4290 Premature rupture of membranes, unspecified as to length of time between rupture and onset of labor, unspecified weeks of gestation: Principal | ICD-10-CM | POA: Diagnosis present

## 2024-04-20 DIAGNOSIS — O3410 Maternal care for benign tumor of corpus uteri, unspecified trimester: Secondary | ICD-10-CM | POA: Diagnosis present

## 2024-04-20 DIAGNOSIS — Z3A37 37 weeks gestation of pregnancy: Secondary | ICD-10-CM | POA: Diagnosis not present

## 2024-04-20 DIAGNOSIS — Z8249 Family history of ischemic heart disease and other diseases of the circulatory system: Secondary | ICD-10-CM

## 2024-04-20 DIAGNOSIS — O26893 Other specified pregnancy related conditions, third trimester: Secondary | ICD-10-CM | POA: Diagnosis present

## 2024-04-20 DIAGNOSIS — O09521 Supervision of elderly multigravida, first trimester: Secondary | ICD-10-CM | POA: Diagnosis present

## 2024-04-20 LAB — URINALYSIS, ROUTINE W REFLEX MICROSCOPIC
Bilirubin Urine: NEGATIVE
Glucose, UA: 50 mg/dL — AB
Hgb urine dipstick: NEGATIVE
Ketones, ur: NEGATIVE mg/dL
Leukocytes,Ua: NEGATIVE
Nitrite: NEGATIVE
Protein, ur: 30 mg/dL — AB
Specific Gravity, Urine: 1.025 (ref 1.005–1.030)
pH: 6 (ref 5.0–8.0)

## 2024-04-20 LAB — WET PREP, GENITAL
Clue Cells Wet Prep HPF POC: NONE SEEN
Sperm: NONE SEEN
Trich, Wet Prep: NONE SEEN
WBC, Wet Prep HPF POC: <10 (ref <10)
Yeast Wet Prep HPF POC: NONE SEEN

## 2024-04-20 LAB — CBC
HCT: 34.1 % — ABNORMAL LOW (ref 36.0–46.0)
Hemoglobin: 12 g/dL (ref 12.0–15.0)
MCH: 32.4 pg (ref 26.0–34.0)
MCHC: 35.2 g/dL (ref 30.0–36.0)
MCV: 92.2 fL (ref 80.0–100.0)
Platelets: 193 K/uL (ref 150–400)
RBC: 3.7 MIL/uL — ABNORMAL LOW (ref 3.87–5.11)
RDW: 13.9 % (ref 11.5–15.5)
WBC: 13.1 K/uL — ABNORMAL HIGH (ref 4.0–10.5)
nRBC: 0 % (ref 0.0–0.2)

## 2024-04-20 LAB — TYPE AND SCREEN
ABO/RH(D): O POS
Antibody Screen: NEGATIVE

## 2024-04-20 LAB — RUPTURE OF MEMBRANE (ROM)PLUS: Rom Plus: POSITIVE

## 2024-04-20 LAB — OB RESULTS CONSOLE HIV ANTIBODY (ROUTINE TESTING): HIV: NONREACTIVE

## 2024-04-20 MED ORDER — SODIUM CHLORIDE 0.9% FLUSH: 3.0000 mL | INTRAVENOUS | Status: DC | PRN

## 2024-04-20 MED ORDER — LACTATED RINGERS IV SOLN: INTRAVENOUS | Status: DC

## 2024-04-20 MED ORDER — MISOPROSTOL 50MCG HALF TABLET
ORAL_TABLET | ORAL | Status: AC
  Administered 2024-04-21: 50 ug via ORAL
  Filled 2024-04-20: qty 1

## 2024-04-20 MED ORDER — ACETAMINOPHEN 500 MG PO TABS: 1000.0000 mg | ORAL_TABLET | Freq: Four times a day (QID) | ORAL | Status: DC | PRN

## 2024-04-20 MED ORDER — OXYTOCIN-SODIUM CHLORIDE 30-0.9 UT/500ML-% IV SOLN
1.0000 m[IU]/min | INTRAVENOUS | Status: DC
  Administered 2024-04-21: 2 m[IU]/min via INTRAVENOUS
  Filled 2024-04-20: qty 500

## 2024-04-20 MED ORDER — OXYTOCIN-SODIUM CHLORIDE 30-0.9 UT/500ML-% IV SOLN: 2.5000 [IU]/h | INTRAVENOUS | Status: DC

## 2024-04-20 MED ORDER — TERBUTALINE SULFATE 1 MG/ML IJ SOLN: 0.2500 mg | Freq: Once | INTRAMUSCULAR | Status: DC | PRN

## 2024-04-20 MED ORDER — SOD CITRATE-CITRIC ACID 500-334 MG/5ML PO SOLN: 30.0000 mL | ORAL | Status: DC | PRN

## 2024-04-20 MED ORDER — LIDOCAINE HCL (PF) 1 % IJ SOLN
30.0000 mL | INTRAMUSCULAR | Status: DC | PRN
  Filled 2024-04-20: qty 30

## 2024-04-20 MED ORDER — SODIUM CHLORIDE 0.9 % IV SOLN: 250.0000 mL | INTRAVENOUS | Status: DC | PRN

## 2024-04-20 MED ORDER — ONDANSETRON HCL 4 MG/2ML IJ SOLN: 4.0000 mg | Freq: Four times a day (QID) | INTRAMUSCULAR | Status: DC | PRN

## 2024-04-20 MED ORDER — LACTATED RINGERS IV SOLN: 500.0000 mL | INTRAVENOUS | Status: DC | PRN

## 2024-04-20 MED ORDER — FENTANYL CITRATE (PF) 100 MCG/2ML IJ SOLN
50.0000 ug | INTRAMUSCULAR | Status: DC | PRN
  Filled 2024-04-20: qty 2

## 2024-04-20 MED ORDER — OXYTOCIN BOLUS FROM INFUSION
333.0000 mL | Freq: Once | INTRAVENOUS | Status: AC
  Administered 2024-04-21: 333 mL via INTRAVENOUS

## 2024-04-20 MED ORDER — SODIUM CHLORIDE 0.9% FLUSH: 3.0000 mL | Freq: Two times a day (BID) | INTRAVENOUS | Status: DC

## 2024-04-20 MED ORDER — MISOPROSTOL 50MCG HALF TABLET
50.0000 ug | ORAL_TABLET | ORAL | Status: DC | PRN
  Administered 2024-04-20: 50 ug via ORAL
  Filled 2024-04-20: qty 1

## 2024-04-20 NOTE — H&P (Signed)
 OB History & Physical   History of Present Illness:   Chief Complaint: water  broke  HPI:  Joanna Lynch is a 38 y.o. G96P2002 female at [redacted]w[redacted]d, Patient's last menstrual period was 07/29/2023 (exact date)., not consistent with US  at [redacted]w[redacted]d, with Estimated Date of Delivery: 05/11/24.  She presents to L&D for leakage of fluid. She felt an initial gush around 1000 today and then has continued to have leaking since.  Her pregnancy is complicated by AMA .  She denies Contractions or Vaginal bleeding. Endorses fetal movement as active.   Reports active fetal movement  Contractions: irregular cramping  LOF/SROM: clear fluid at 1000 Vaginal bleeding: none  Factors complicating pregnancy:  Principal Problem:   Rupture of membranes with delay of delivery Active Problems:   AMA (advanced maternal age) multigravida 35+, first trimester   Uterine fibroids affecting pregnancy, antepartum    Prenatal care site:  [facility]  Patient Active Problem List   Diagnosis Date Noted   Rupture of membranes with delay of delivery 04/20/2024   AMA (advanced maternal age) multigravida 35+, first trimester 10/18/2023   Uterine fibroids affecting pregnancy, antepartum 10/18/2023    Prenatal Transfer Tool  Maternal Diabetes: No Genetic Screening: Normal Maternal Ultrasounds/Referrals: Normal Fetal Ultrasounds or other Referrals:  None Maternal Substance Abuse:  No Significant Maternal Medications:  None Significant Maternal Lab Results: Group B Strep negative  Maternal Medical History:   Past Medical History:  Diagnosis Date   Appendicitis 10/2016   no surgery, was given medication    Past Surgical History:  Procedure Laterality Date   NASAL SEPTUM SURGERY  2005    No Known Allergies  Prior to Admission medications   Medication Sig Start Date End Date Taking? Authorizing Provider  Prenatal Vit-Fe Fumarate-FA (PRENATAL MULTIVITAMIN) TABS tablet Take 1 tablet by mouth daily  at 12 noon.   Yes [provider]  levonorgestrel  (MIRENA ) 20 MCG/24HR IUD 1 each by Intrauterine route once.    [provider]    OB History  Gravida Para Term Preterm AB Living  3 2 2  0 0 2  SAB IAB Ectopic Multiple Live Births  0 0 0 0 2    # Outcome Date GA Lbr Len/2nd Weight Sex Type Anes PTL Lv  3 Current           2 Term 03/11/18 [redacted]w[redacted]d / 00:08 3760 g F Vag-Spont EPI  LIV     Name: Oltmann,GIRL Chandi     Apgar1: 8  Apgar5: 9  1 Term 09/17/16 [redacted]w[redacted]d / 01:56 3690 g F Vag-Spont EPI  LIV     Name: Runkles,GIRL Niylah     Apgar1: 8  Apgar5: 9     Social History: She  reports that she has never smoked. She has never used smokeless tobacco. She reports that she does not drink alcohol and does not use drugs.  Family History: family history includes Cancer in her maternal aunt; Healthy in her mother; Hypertension in her father.   Review of Systems: A full review of systems was performed and negative except as noted in the HPI.     Physical Exam:  Vital Signs: BP 128/73 (BP Location: Left Arm)   Pulse 90   Temp 98.1 F (36.7 C) (Oral)   Resp 15   Ht 5' 6 (1.676 m)   Wt 88.9 kg   LMP 07/29/2023 (Exact Date)   BMI 31.64 kg/m   General: no acute distress.  HEENT: normocephalic, atraumatic Heart: regular  rate & rhythm Lungs: normal respiratory effort Abdomen: soft, gravid, non-tender;  EFW: 7 1/2 lbs  Pelvic:   External: Normal external female genitalia  Cervix: Dilation: 1 / Effacement (%): 50 / Station: -2    Extremities: non-tender, symmetric, no edema bilaterally.  DTRs: 2+/2+  Neurologic: Alert & oriented x 3.    Results for orders placed or performed during the hospital encounter of 04/20/24 (from the past 24 hours)  Urinalysis, Routine w reflex microscopic -Urine, Clean Catch     Status: Abnormal   Collection Time: 04/20/24  6:05 PM  Result Value Ref Range   Color, Urine YELLOW (A) YELLOW   APPearance HAZY (A) CLEAR   Specific Gravity, Urine  1.025 1.005 - 1.030   pH 6.0 5.0 - 8.0   Glucose, UA 50 (A) NEGATIVE mg/dL   Hgb urine dipstick NEGATIVE NEGATIVE   Bilirubin Urine NEGATIVE NEGATIVE   Ketones, ur NEGATIVE NEGATIVE mg/dL   Protein, ur 30 (A) NEGATIVE mg/dL   Nitrite NEGATIVE NEGATIVE   Leukocytes,Ua NEGATIVE NEGATIVE   RBC / HPF 0-5 0 - 5 RBC/hpf   WBC, UA 0-5 0 - 5 WBC/hpf   Bacteria, UA RARE (A) NONE SEEN   Squamous Epithelial / HPF 0-5 0 - 5 /HPF   Mucus PRESENT    Ca Oxalate Crys, UA PRESENT    Non Squamous Epithelial PRESENT (A) NONE SEEN  Wet prep, genital     Status: None   Collection Time: 04/20/24  6:05 PM   Specimen: Urine, Clean Catch  Result Value Ref Range   Yeast Wet Prep HPF POC NONE SEEN NONE SEEN   Trich, Wet Prep NONE SEEN NONE SEEN   Clue Cells Wet Prep HPF POC NONE SEEN NONE SEEN   WBC, Wet Prep HPF POC <10 <10   Sperm NONE SEEN   Rupture of Membrane (ROM) Plus     Status: None   Collection Time: 04/20/24  6:05 PM  Result Value Ref Range   Rom Plus POSITIVE   CBC     Status: Abnormal   Collection Time: 04/20/24  8:42 PM  Result Value Ref Range   WBC 13.1 (H) 4.0 - 10.5 K/uL   RBC 3.70 (L) 3.87 - 5.11 MIL/uL   Hemoglobin 12.0 12.0 - 15.0 g/dL   HCT 65.8 (L) 63.9 - 53.9 %   MCV 92.2 80.0 - 100.0 fL   MCH 32.4 26.0 - 34.0 pg   MCHC 35.2 30.0 - 36.0 g/dL   RDW 86.0 88.4 - 84.4 %   Platelets 193 150 - 400 K/uL   nRBC 0.0 0.0 - 0.2 %  Type and screen [facility]     Status: None (Preliminary result)   Collection Time: 04/20/24  8:42 PM  Result Value Ref Range   ABO/RH(D) PENDING    Antibody Screen PENDING    Sample Expiration      04/23/2024,2359 Performed at [facility], [address] [place], [place] [address]     Pertinent Results:  Prenatal Labs: Blood type/Rh O pos  Antibody screen Negative    Rubella Immune    Varicella Immune  RPR NR    HBsAg Neg   Hep C NR   HIV Neg    GC neg  Chlamydia neg  Genetic screening cfDNA negative   1  hour GTT 106  3 hour GTT N/A  GBS Neg     FHT:  FHR: 135 bpm, variability: moderate,  accelerations:  Present,  decelerations:  Absent Category/reactivity:  Category I UC:   Irregular, mild contractions    Cephalic by Leop[place] and SVE   No results found.  Assessment:  Joanna Lynch is a 38 y.o. G27P2002 female at [redacted]w[redacted]d with PROM.   Plan:  1. Admit to Labor & Delivery - Admission status: Inpatient - Dr IVAR Dinsmore MD notified of admission and plan of care  - Reason for admission: labor management - consents reviewed and obtained  2. Fetal Well being  - Fetal Tracing: cat 1 - Group B Streptococcus ppx not indicated: GBS negative - Presentation: cephalic confirmed by SVE   3. Routine OB: - Prenatal labs reviewed, as above - Rh positive - CBC, T&S, RPR on admit - Regular diet, saline lock  4. Monitoring of labor  - Contractions monitored with external toco - Pelvis proven to 3760 grams, adequate for trial of labor  - Plan for augmentation with misoprostol   - Augmentation with oxytocin  as appropriate  - Plan for  continuous fetal monitoring - Maternal pain control as desired; planning regional anesthesia - Anticipate vaginal delivery  5. Post Partum Planning: - Infant feeding: breast feeding - Contraception: IUD - Mirena   - Flu vaccine: declined  - Tdap vaccine: Given prenatally - RSV vaccine: Not in season   Therisa CHRISTELLA Pillow, [place] 04/20/24 9:42 PM  Therisa Pillow, CNM Certified Nurse Midwife [place]  [facility]

## 2024-04-20 NOTE — OB Triage Note (Signed)
 Patient is a G3P2002 at [redacted]w[redacted]d who presents to unit c/o leakage of clear fluid since around 1000. Reports +fetal movement, denies vaginal bleeding and contractions. External monitors applied and assessing. Initial FHT 145. Vital signs WDL.

## 2024-04-21 ENCOUNTER — Encounter: Payer: Self-pay | Admitting: Obstetrics and Gynecology

## 2024-04-21 ENCOUNTER — Inpatient Hospital Stay: Admitting: Anesthesiology

## 2024-04-21 LAB — RPR: RPR Ser Ql: NONREACTIVE

## 2024-04-21 MED ORDER — BENZOCAINE-MENTHOL 20-0.5 % EX AERO
1.0000 | INHALATION_SPRAY | CUTANEOUS | Status: DC | PRN
  Administered 2024-04-21: 1 via TOPICAL
  Filled 2024-04-21: qty 56

## 2024-04-21 MED ORDER — COCONUT OIL OIL: 1.0000 | TOPICAL_OIL | Status: DC | PRN

## 2024-04-21 MED ORDER — ACETAMINOPHEN 325 MG PO TABS
650.0000 mg | ORAL_TABLET | ORAL | Status: DC | PRN
  Administered 2024-04-21: 650 mg via ORAL
  Filled 2024-04-21: qty 2

## 2024-04-21 MED ORDER — LIDOCAINE HCL (PF) 1 % IJ SOLN
INTRAMUSCULAR | Status: DC | PRN
  Administered 2024-04-21: 3 mL via INTRADERMAL

## 2024-04-21 MED ORDER — FLEET ENEMA RE ENEM: 1.0000 | ENEMA | Freq: Every day | RECTAL | Status: DC | PRN

## 2024-04-21 MED ORDER — FENTANYL-BUPIVACAINE-NACL 0.5-0.125-0.9 MG/250ML-% EP SOLN
EPIDURAL | Status: AC
  Filled 2024-04-21: qty 250

## 2024-04-21 MED ORDER — PHENYLEPHRINE 80 MCG/ML (10ML) SYRINGE FOR IV PUSH (FOR BLOOD PRESSURE SUPPORT): 80.0000 ug | PREFILLED_SYRINGE | INTRAVENOUS | Status: DC | PRN

## 2024-04-21 MED ORDER — SIMETHICONE 80 MG PO CHEW: 80.0000 mg | CHEWABLE_TABLET | ORAL | Status: DC | PRN

## 2024-04-21 MED ORDER — BISACODYL 10 MG RE SUPP: 10.0000 mg | Freq: Every day | RECTAL | Status: DC | PRN

## 2024-04-21 MED ORDER — AMMONIA AROMATIC IN INHA
RESPIRATORY_TRACT | Status: AC
  Filled 2024-04-21: qty 10

## 2024-04-21 MED ORDER — LACTATED RINGERS IV SOLN
500.0000 mL | Freq: Once | INTRAVENOUS | Status: AC
  Administered 2024-04-21: 500 mL via INTRAVENOUS

## 2024-04-21 MED ORDER — PRENATAL MULTIVITAMIN CH
1.0000 | ORAL_TABLET | Freq: Every day | ORAL | Status: DC
  Administered 2024-04-22: 1 via ORAL
  Filled 2024-04-21 (×2): qty 1

## 2024-04-21 MED ORDER — DIPHENHYDRAMINE HCL 50 MG/ML IJ SOLN: 12.5000 mg | INTRAMUSCULAR | Status: DC | PRN

## 2024-04-21 MED ORDER — EPHEDRINE 5 MG/ML INJ: 10.0000 mg | INTRAVENOUS | Status: DC | PRN

## 2024-04-21 MED ORDER — DIBUCAINE (PERIANAL) 1 % EX OINT: 1.0000 | TOPICAL_OINTMENT | CUTANEOUS | Status: DC | PRN

## 2024-04-21 MED ORDER — ONDANSETRON HCL 4 MG PO TABS: 4.0000 mg | ORAL_TABLET | ORAL | Status: DC | PRN

## 2024-04-21 MED ORDER — OXYTOCIN 10 UNIT/ML IJ SOLN
INTRAMUSCULAR | Status: AC
  Filled 2024-04-21: qty 2

## 2024-04-21 MED ORDER — OXYCODONE HCL 5 MG PO TABS: 5.0000 mg | ORAL_TABLET | ORAL | Status: DC | PRN

## 2024-04-21 MED ORDER — BUPIVACAINE HCL (PF) 0.25 % IJ SOLN
INTRAMUSCULAR | Status: DC | PRN
  Administered 2024-04-21 (×2): 5 mL via EPIDURAL

## 2024-04-21 MED ORDER — FENTANYL-BUPIVACAINE-NACL 0.5-0.125-0.9 MG/250ML-% EP SOLN
12.0000 mL/h | EPIDURAL | Status: DC | PRN
  Administered 2024-04-21: 12 mL/h via EPIDURAL

## 2024-04-21 MED ORDER — IBUPROFEN 600 MG PO TABS
600.0000 mg | ORAL_TABLET | Freq: Four times a day (QID) | ORAL | Status: DC
  Administered 2024-04-21 – 2024-04-22 (×4): 600 mg via ORAL
  Filled 2024-04-21 (×4): qty 1

## 2024-04-21 MED ORDER — WITCH HAZEL-GLYCERIN EX PADS
1.0000 | MEDICATED_PAD | CUTANEOUS | Status: DC | PRN
  Administered 2024-04-21: 1 via TOPICAL
  Filled 2024-04-21: qty 100

## 2024-04-21 MED ORDER — SENNOSIDES-DOCUSATE SODIUM 8.6-50 MG PO TABS
2.0000 | ORAL_TABLET | ORAL | Status: DC
  Administered 2024-04-21: 2 via ORAL
  Filled 2024-04-21: qty 2

## 2024-04-21 MED ORDER — ZOLPIDEM TARTRATE 5 MG PO TABS: 5.0000 mg | ORAL_TABLET | Freq: Every evening | ORAL | Status: DC | PRN

## 2024-04-21 MED ORDER — DIPHENHYDRAMINE HCL 25 MG PO CAPS: 25.0000 mg | ORAL_CAPSULE | Freq: Four times a day (QID) | ORAL | Status: DC | PRN

## 2024-04-21 MED ORDER — MISOPROSTOL 200 MCG PO TABS
ORAL_TABLET | ORAL | Status: DC
Start: 2024-04-21 — End: 2024-04-21
  Filled 2024-04-21: qty 4

## 2024-04-21 MED ORDER — ONDANSETRON HCL 4 MG/2ML IJ SOLN: 4.0000 mg | INTRAMUSCULAR | Status: DC | PRN

## 2024-04-21 MED ORDER — LIDOCAINE-EPINEPHRINE (PF) 1.5 %-1:200000 IJ SOLN
INTRAMUSCULAR | Status: DC | PRN
  Administered 2024-04-21: 3 mL via PERINEURAL

## 2024-04-21 NOTE — Progress Notes (Signed)
 L&D Note    Subjective:  Report irregular cramping   Objective:    Current Vital Signs 24h Vital Sign Ranges  T 98.3 F (36.8 C) Temp  Avg: 98.2 F (36.8 C)  Min: 98.1 F (36.7 C)  Max: 98.3 F (36.8 C)  BP 116/71 BP  Min: 116/71  Max: 128/73  HR 82 Pulse  Avg: 86  Min: 82  Max: 90  RR 14 Resp  Avg: 14.5  Min: 14  Max: 15  SaO2     No data recorded      Gen: alert, cooperative, no distress FHR: Baseline: 140 bpm, Variability: moderate, Accels: Present, Decels: none Toco: irregular, mild contractions  SVE: Dilation: 1 Effacement (%): 50 Cervical Position: Posterior Station: -2 Presentation: Vertex Exam by:: Vernel, CNM  Medications SCHEDULED MEDICATIONS   oxytocin  40 units in LR 1000 mL  333 mL Intravenous Once   sodium chloride  flush  3 mL Intravenous Q12H    MEDICATION INFUSIONS   sodium chloride      lactated ringers      lactated ringers  125 mL/hr at 04/21/24 0646   oxytocin      oxytocin  2 milli-units/min (04/21/24 0651)    PRN MEDICATIONS  sodium chloride , acetaminophen , fentaNYL  (SUBLIMAZE ) injection, lactated ringers , lidocaine  (PF), misoprostol , ondansetron , sodium chloride  flush, sodium citrate -citric acid , terbutaline    Assessment & Plan:  38 y.o. G3P2002 at [redacted]w[redacted]d admitted for PROM -Labor: Not in labor. S/P 2 doses of oral misoprostol . Ceriah desired to avoid oxytocin . Discussed that cervical exam has been unchanged and misoprostol  has not caused regular contractions or early labor to start. Recommend proceeding with oxytocin  for augmentation in setting of PROM. Risk/benefits of oxytocin  versus expectant management discussed. Terry and husband consent to augmentation with oxytocin . -Fetal Well-being: Category I -GBS: negative -Membranes ruptured, clear fluid, spontaneously at 1000  -Intervention: IV Pitocin  augmentation -Analgesia: regional anesthesia when desired    Therisa CHRISTELLA Vernel, [facility]  04/21/2024 6:53 AM  [facility]

## 2024-04-21 NOTE — Anesthesia Preprocedure Evaluation (Signed)
 Anesthesia Evaluation  Patient identified by MRN, date of birth, ID band Patient awake    Reviewed: Allergy & Precautions, H&P , NPO status , Patient's Chart, lab work & pertinent test results  Airway Mallampati: II  TM Distance: >3 FB Neck ROM: full    Dental no notable dental hx. (+) Teeth Intact   Pulmonary neg pulmonary ROS   Pulmonary exam normal breath sounds clear to auscultation       Cardiovascular negative cardio ROS Normal cardiovascular exam Rhythm:regular Rate:Normal     Neuro/Psych Scoliosis lower spine  negative psych ROS   GI/Hepatic negative GI ROS, Neg liver ROS,,,  Endo/Other  negative endocrine ROS    Renal/GU negative Renal ROS  negative genitourinary   Musculoskeletal   Abdominal   Peds  Hematology negative hematology ROS (+)   Anesthesia Other Findings   Reproductive/Obstetrics (+) Pregnancy                              Anesthesia Physical Anesthesia Plan  ASA: 2  Anesthesia Plan: Epidural   Post-op Pain Management:    Induction:   PONV Risk Score and Plan:   Airway Management Planned:   Additional Equipment:   Intra-op Plan:   Post-operative Plan:   Informed Consent: I have reviewed the patients History and Physical, chart, labs and discussed the procedure including the risks, benefits and alternatives for the proposed anesthesia with the patient or authorized representative who has indicated his/her understanding and acceptance.     Dental Advisory Given  Plan Discussed with: CRNA and Anesthesiologist  Anesthesia Plan Comments:         Anesthesia Quick Evaluation

## 2024-04-21 NOTE — Discharge Summary (Signed)
 Postpartum Discharge Summary  Patient Name: Joanna Lynch DOB: 1986/05/23 MRN: [id number]  Date of admission: 04/20/2024 Delivery date:04/21/2024 Delivering provider: Nima Kemppainen Date of discharge: 04/22/2024  Primary OB: [facility] OFE:Ejupzwu'd last menstrual period was 07/29/2023 (exact date). EDC Estimated Date of Delivery: 05/11/24 Gestational Age at Delivery: [redacted]w[redacted]d   Admitting diagnosis: Rupture of membranes with delay of delivery [O42.90] Intrauterine pregnancy: [redacted]w[redacted]d     Secondary diagnosis:   Principal Problem:   NSVD (normal spontaneous vaginal delivery) Active Problems:   Rupture of membranes with delay of delivery   AMA (advanced maternal age) multigravida 35+, first trimester   Uterine fibroids affecting pregnancy, antepartum   Discharge Diagnosis: Term Pregnancy Delivered      Hospital course: Onset of Labor With Vaginal Delivery      38 y.o. yo G3P3003 at [redacted]w[redacted]d was admitted in Latent Labor on 04/20/2024. Labor course was complicated by prolonged rupture of membranes and category 2 FHT with variable decelerations immediately after epidural placement approximately 20 minutes prior to delivery  Membrane Rupture Time/Date: 10:00 AM,04/20/2024  Delivery Method:Vaginal, Spontaneous Operative Delivery:N/A Episiotomy: None Lacerations:  None;2nd degree Patient had a postpartum course complicated by none.  She is ambulating, tolerating a regular diet, passing flatus, and urinating well. Patient is discharged home in stable condition on 04/22/24.  Newborn Data: Birth date:04/21/2024 Birth time:6:00 PM Gender:Female Living status:Living Apgars:8 ,9  Weight:3320 g                                            Post partum procedures:none Augmentation:: Pitocin  and Cytotec  Complications: ROM>24 hours Delivery Type: spontaneous vaginal delivery Anesthesia: epidural anesthesia Placenta: spontaneous To Pathology: No   Prenatal Labs:   Blood type/Rh O pos   Antibody screen Negative    Rubella Immune    Varicella Immune  RPR NR    HBsAg Neg   Hep C NR   HIV Neg    GC neg  Chlamydia neg  Genetic screening cfDNA negative   1 hour GTT 106  3 hour GTT N/A  GBS Neg       Magnesium Sulfate received: No BMZ received: No Rhophylac:was not indicated MMR: was not indicated Varivax vaccine given: was not indicated - Tdap vaccine: Given prenatally - Flu vaccine: declined -RSV vaccine:not in season  Transfusion:No  Physical exam  Vitals:   04/22/24 0115 04/22/24 0308 04/22/24 0830 04/22/24 1630  BP: 112/67 113/68 110/74 117/77  Pulse: 60 (!) 57 68 61  Resp: 16 18 18 17   Temp: 98 F (36.7 C) 98 F (36.7 C) 97.6 F (36.4 C)   TempSrc: Oral Oral Oral   SpO2: 99% 98% 99% 97%  Weight:      Height:       General: alert Lochia: appropriate Uterine Fundus: firm Perineum:minimal edema/repair well approximated DVT Evaluation: No evidence of DVT seen on physical exam.  Labs: Lab Results  Component Value Date   WBC 13.1 (H) 04/22/2024   HGB 10.7 (L) 04/22/2024   HCT 30.3 (L) 04/22/2024   MCV 93.8 04/22/2024   PLT 151 04/22/2024      Latest Ref Rng & Units 11/01/2016    5:00 AM  CMP  Glucose 65 - 99 mg/dL 894   BUN 6 - 20 mg/dL 8   Creatinine 9.55 - 8.99 mg/dL 9.06   Sodium 864 - 854 mmol/L 139  Potassium 3.5 - 5.1 mmol/L 3.6   Chloride 101 - 111 mmol/L 107   CO2 22 - 32 mmol/L 27   Calcium 8.9 - 10.3 mg/dL 8.1    Edinburgh Score:    04/22/2024   11:59 AM  Edinburgh Postnatal Depression Scale Screening Tool  I have been able to laugh and see the funny side of things. 0  I have looked forward with enjoyment to things. 0  I have blamed myself unnecessarily when things went wrong. 1  I have been anxious or worried for no good reason. 1  I have felt scared or panicky for no good reason. 1  Things have been getting on top of me. 1  I have been so unhappy that I have had difficulty sleeping. 1  I have felt sad or  miserable. 1  I have been so unhappy that I have been crying. 0  The thought of harming myself has occurred to me. 0  Edinburgh Postnatal Depression Scale Total 6    Risk assessment for postpartum VTE and prophylactic treatment: Very high risk factors: None High risk factors: None Moderate risk factors: BMI 30-40 kg/m2  Postpartum VTE prophylaxis with LMWH not indicated  After visit meds:  Allergies as of 04/22/2024   No Known Allergies      Medication List     STOP taking these medications    levonorgestrel  20 MCG/24HR IUD Commonly known as: MIRENA        TAKE these medications    prenatal multivitamin Tabs tablet Take 1 tablet by mouth daily at 12 noon.       Discharge home in stable condition Infant Feeding: Breast Infant Disposition:home with mother Discharge instruction: per After Visit Summary and Postpartum booklet. Activity: Advance as tolerated. Pelvic rest for 6 weeks.  Diet: routine diet Anticipated Birth Control:  Contraceptives: IUD Mirena  Postpartum Appointment:6 weeks Additional Postpartum F/U: none Future Appointments:No future appointments. Follow up Visit:  Follow-up Information     Aisha Heller, CNM. Schedule an appointment as soon as possible for a visit in 6 week(s).   Specialty: Obstetrics Contact information: [address] [place] [address] [phone]                 Plan:  Joanna Lynch was discharged to home in good condition. Follow-up appointment as directed.    SignedBETHA Margery FORBES Myron 04/22/2024 6:08 PM

## 2024-04-21 NOTE — Anesthesia Procedure Notes (Signed)
 Epidural Patient location during procedure: OB Start time: 04/21/2024 4:55 PM End time: 04/21/2024 5:25 PM  Staffing Anesthesiologist: Piscitello, Fairy POUR, MD Resident/CRNA: Landy Hone D, CRNA Performed: resident/CRNA   Preanesthetic Checklist Completed: patient identified, IV checked, site marked, risks and benefits discussed, surgical consent, monitors and equipment checked, pre-op evaluation and timeout performed  Epidural Patient position: sitting Prep: ChloraPrep Patient monitoring: heart rate, continuous pulse ox and blood pressure Approach: midline Location: L3-L4 Injection technique: LOR saline  Needle:  Needle type: Tuohy  Needle gauge: 17 G Needle length: 9 cm and 9 Needle insertion depth: 6 cm Catheter type: closed end flexible Catheter size: 19 Gauge Catheter at skin depth: 12 cm Test dose: negative and 1.5% lidocaine  with Epi 1:200 K  Assessment Sensory level: T10 Events: blood not aspirated, no cerebrospinal fluid, injection not painful, no injection resistance, no paresthesia and negative IV test  Additional Notes 1 attempt Pt. Evaluated and documentation done after procedure finished. Patient identified. Risks/Benefits/Options discussed with patient including but not limited to bleeding, infection, nerve damage, paralysis, failed block, incomplete pain control, headache, blood pressure changes, nausea, vomiting, reactions to medication both or allergic, itching and postpartum back pain. Confirmed with bedside nurse the patient's most recent platelet count. Confirmed with patient that they are not currently taking any anticoagulation, have any bleeding history or any family history of bleeding disorders. Patient expressed understanding and wished to proceed. All questions were answered. Sterile technique was used throughout the entire procedure. Please see nursing notes for vital signs. Test dose was given through epidural catheter and negative prior to continuing  to dose epidural or start infusion. Warning signs of high block given to the patient including shortness of breath, tingling/numbness in hands, complete motor block, or any concerning symptoms with instructions to call for help. Patient was given instructions on fall risk and not to get out of bed. All questions and concerns addressed with instructions to call with any issues or inadequate analgesia.    Patient tolerated the insertion well without immediate complications.Reason for block:procedure for pain

## 2024-04-21 NOTE — Progress Notes (Signed)
 L&D Note    Subjective:  Reports feeling contractions, but not uncomfortable.  Objective:      04/21/2024   12:00 PM 04/21/2024    7:12 AM 04/21/2024   12:12 AM  Vitals with BMI  Systolic 122 114 883  Diastolic 78 95 71  Pulse 62 73 82     Gen: alert, cooperative, no distress FHR: Baseline: 135 bpm, Variability: moderate, Accels: Present, Decels: none IUPC: regular, every 2 minutes SVE: Dilation: 2.5 Effacement (%): 50 Cervical Position: Posterior Station: -2 Presentation: Vertex Exam by:: F Ousmane Seeman CNM  Medications SCHEDULED MEDICATIONS   oxytocin  40 units in LR 1000 mL  333 mL Intravenous Once   sodium chloride  flush  3 mL Intravenous Q12H    MEDICATION INFUSIONS   sodium chloride      lactated ringers  999 mL/hr at 04/21/24 0945   lactated ringers  125 mL/hr at 04/21/24 0646   oxytocin      oxytocin  16 milli-units/min (04/21/24 1202)    PRN MEDICATIONS  sodium chloride , acetaminophen , fentaNYL  (SUBLIMAZE ) injection, lactated ringers , lidocaine  (PF), misoprostol , ondansetron , sodium chloride  flush, sodium citrate -citric acid , terbutaline    Assessment & Plan:  38 y.o. G3P2002 at [redacted]w[redacted]d admitted for Labor_induction_indication: Spontaneous rupture of BOW -GBS: negative -IP Antibiotics: abx: none -Membranes ruptured, clear fluid, x 27 hours -Recheck:Evaluated by digital exam. -Preeclampsia:  labs stable -Pain: none -Intervention: IV Pitocin  augmentation, increase Pitocin  rate, and place IUPC -Pe_uterus_labor: Pitocin  at 16 mu/min., Adequate uterine activity by palpation and/or maternal perception., and Adequate relaxation between contractions. -Analgesia: unmedicated labor support options    Broderic Bara, CNM  04/21/2024 12:57 PM  [facility]

## 2024-04-22 ENCOUNTER — Encounter: Payer: Self-pay | Admitting: Obstetrics and Gynecology

## 2024-04-22 LAB — CBC
HCT: 30.3 % — ABNORMAL LOW (ref 36.0–46.0)
Hemoglobin: 10.7 g/dL — ABNORMAL LOW (ref 12.0–15.0)
MCH: 33.1 pg (ref 26.0–34.0)
MCHC: 35.3 g/dL (ref 30.0–36.0)
MCV: 93.8 fL (ref 80.0–100.0)
Platelets: 151 K/uL (ref 150–400)
RBC: 3.23 MIL/uL — ABNORMAL LOW (ref 3.87–5.11)
RDW: 14 % (ref 11.5–15.5)
WBC: 13.1 K/uL — ABNORMAL HIGH (ref 4.0–10.5)
nRBC: 0 % (ref 0.0–0.2)

## 2024-04-22 NOTE — Anesthesia Postprocedure Evaluation (Signed)
 Anesthesia Post Note  Patient: Joanna Lynch  Procedure(s) Performed: AN AD HOC LABOR EPIDURAL  Patient location during evaluation: Mother Baby Anesthesia Type: Epidural Level of consciousness: awake and alert Pain management: pain level controlled Vital Signs Assessment: post-procedure vital signs reviewed and stable Respiratory status: spontaneous breathing, nonlabored ventilation and respiratory function stable Cardiovascular status: stable Postop Assessment: no headache, no backache and epidural receding Anesthetic complications: no   No notable events documented.   Last Vitals:  Vitals:   04/22/24 0115 04/22/24 0308  BP: 112/67 113/68  Pulse: 60 (!) 57  Resp: 16 18  Temp: 36.7 C 36.7 C  SpO2: 99% 98%    Last Pain:  Vitals:   04/22/24 0308  TempSrc: Oral  PainSc:                  Madalyn Hammock

## 2024-04-22 NOTE — Lactation Note (Signed)
 This note was copied from a baby's chart. Lactation Consultation Note  Patient Name: Joanna Lynch Unijb'd Date: 04/22/2024 Age:38 hours Reason for consult: Initial assessment;Early term 37-38.6wks;Other (Comment) (Discharge Education)   Maternal Data Does the patient have breastfeeding experience prior to this delivery?: Yes How long did the patient breastfeed?: 1 - 6mon, 2 - 13mon  Initial assessment and discharge education w/ an experienced breastfeeding parent.  This is a P3 patient and a 20hr old baby Joanna delivered vaginally.  Only factor to possibly affect lactation is mom is AMA.  Mom verbalized that she breastfed her other children with no major issues.  Feeding Mother's Current Feeding Choice: Breast Milk  Lactation Tools Discussed/Used Patient provided w/ a Medela Hand pump, and given education on how to properly use.  LC provided guidance on using a portable pump to help maximize milk supply or when to look out for issues.  Interventions Interventions: Breast feeding basics reviewed;Hand pump;Education;Infant Driven Feeding Algorithm education;CDC milk storage guidelines  LC provided education on the following;  milk production expectations, hunger cues, day 1/2 wet/dirty diapers, hand expression, cluster feeding, benefits of STS and arousing infant for a feeding.  Lactation informed patient of feeding infant at least 8 or more times w/in a 24hr period but not exceeding 3hrs. Patient verbalized understanding.   Discharge Discharge Education: Engorgement and breast care;Warning signs for feeding baby;Outpatient recommendation Pump: Personal;Hands [name] (Mom Cozy)  Education on engorgement prevention/treatment was discussed as well as breastmilk storage guidelines.  LC provided patient with a handout on breastmilk storage guidelines from [facility]. [facility] outpatient lactation services phone number written on the white board in the room.  Patient verbalized understanding.  LC also  provided education from the postpartum book about warning signs to look for in a poor feeding.    Consult Status Consult Status: PRN    Lillianna Sabel S Divit Stipp 04/22/2024, 3:23 PM

## 2024-04-22 NOTE — Progress Notes (Signed)
 Discharge instructions given and reviewed with pt and family. Pt educated on follow up care, appointments and when to notify provider, questions invited and answered. ID bands matched with infant.  Pt and family noted understanding  to teaching/education.Pt escorted off unit by RN in wheelchair with infant safely in arms. Infant secured in car seat by family.
